# Patient Record
Sex: Female | Born: 1965 | Race: Asian
Health system: Southern US, Academic
[De-identification: ages and names within clinical notes are randomized; demographics above are authoritative.]

## PROBLEM LIST (undated history)

## (undated) ENCOUNTER — Encounter

## (undated) ENCOUNTER — Telehealth

## (undated) ENCOUNTER — Other Ambulatory Visit

## (undated) ENCOUNTER — Encounter: Attending: Obstetrics & Gynecology

## (undated) ENCOUNTER — Encounter: Attending: Dermatology

## (undated) ENCOUNTER — Encounter: Attending: Women's Health

## (undated) ENCOUNTER — Encounter: Attending: Family

## (undated) ENCOUNTER — Inpatient Hospital Stay

## (undated) ENCOUNTER — Telehealth: Attending: Dermatology

## (undated) ENCOUNTER — Telehealth: Attending: Family

## (undated) DIAGNOSIS — E119 Type 2 diabetes mellitus without complications: Secondary | ICD-10-CM

## (undated) DIAGNOSIS — T8859XA Other complications of anesthesia, initial encounter: Secondary | ICD-10-CM

## (undated) DIAGNOSIS — F419 Anxiety disorder, unspecified: Secondary | ICD-10-CM

## (undated) HISTORY — PX: BREAST SURGERY: SHX581

## (undated) HISTORY — PX: TONSILLECTOMY: SUR1361

## (undated) HISTORY — PX: ABDOMINAL HYSTERECTOMY: SHX81

---

## 2015-08-23 ENCOUNTER — Encounter: Attending: Family Medicine

## 2015-08-27 ENCOUNTER — Encounter: Attending: Family Medicine

## 2015-09-03 ENCOUNTER — Inpatient Hospital Stay: Admit: 2015-09-03 | Discharge: 2015-09-04

## 2015-09-03 ENCOUNTER — Ambulatory Visit: Attending: Family Medicine

## 2015-09-03 DIAGNOSIS — D500 Iron deficiency anemia secondary to blood loss (chronic): Principal | ICD-10-CM

## 2015-09-03 DIAGNOSIS — I10 Essential (primary) hypertension: Principal | ICD-10-CM

## 2015-09-03 DIAGNOSIS — D501 Sideropenic dysphagia: Secondary | ICD-10-CM

## 2015-09-03 DIAGNOSIS — D649 Anemia, unspecified: Secondary | ICD-10-CM

## 2015-09-03 DIAGNOSIS — H0016 Chalazion left eye, unspecified eyelid: Secondary | ICD-10-CM

## 2015-09-03 MED ORDER — FERROUS SULFATE 325 (65 FE) MG PO TABS-TBEC JX
325 mg | Freq: Three times a day (TID) | ORAL | 3 refills | Status: CP
Start: 2015-09-03 — End: 2015-09-03

## 2015-09-03 MED ORDER — DOCUSATE SODIUM 100 MG PO CAPS
100 mg | Freq: Two times a day (BID) | ORAL | 6 refills | Status: CP | PRN
Start: 2015-09-03 — End: ?

## 2015-09-03 MED ORDER — ERYTHROMYCIN 5 MG/GM OP OINT
1 | Freq: Three times a day (TID) | OPHTHALMIC | 0 refills | Status: CP
Start: 2015-09-03 — End: 2015-09-03

## 2015-09-03 MED ORDER — DOCUSATE SODIUM 100 MG PO CAPS
100 mg | Freq: Two times a day (BID) | ORAL | 6 refills | Status: CP | PRN
Start: 2015-09-03 — End: 2015-09-03

## 2015-09-03 MED ORDER — ERYTHROMYCIN 5 MG/GM OP OINT
1 | Freq: Three times a day (TID) | OPHTHALMIC | 0 refills | Status: CP
Start: 2015-09-03 — End: ?

## 2015-09-03 MED ORDER — FERROUS SULFATE 325 (65 FE) MG PO TABS-TBEC JX
325 mg | Freq: Three times a day (TID) | ORAL | 3 refills | Status: CP
Start: 2015-09-03 — End: 2017-10-04

## 2015-09-09 NOTE — Progress Notes
-  TE at 09/03/15 1502      Position Sitting    -TE at 09/03/15 1407 Sitting    -TE at 09/03/15 1502      Pulse 69    -TE at 09/03/15 1407       Resp 16    -TE at 09/03/15 1407       Temp 36.9 ?C (98.5 ?F)    -TE at 09/03/15 1407       Temperature Source Oral    -TE at 09/03/15 1407       Pain Score Zero    -TE at 09/03/15 1407       Last Menstrual Period 08/27/15    -TE at 09/03/15 1407       Education/Communication Barriers?    Learning/Communication Barriers? No    -TE at 09/03/15 1407       Fall Risk Assessment    Had recent fall / Last 6 months? No recent fall    -TE at 09/03/15 1407       Does patient have a fear of falling? No    -TE at 09/03/15 1407         User Key  (r) = Recorded By, (t) = Taken By, (c) = Cosigned By    Initials Name Effective Dates    Ella Jubilee, [place] 05/14/15 -         Physical Exam   Constitutional: She is oriented to person, place, and time.   HENT:   Head: Normocephalic and atraumatic.   Mouth/Throat: Oropharynx is clear and moist.   Eyes: Conjunctivae and EOM are normal. Pupils are equal, round, and reactive to light. No scleral icterus.   At corner of  Left  Eye with   Bump  Papular, With edema, white discharge,  Non tender  Mild  Erythema    Cardiovascular: Normal rate, regular rhythm and normal heart sounds.    Pulmonary/Chest: Effort normal and breath sounds normal. No respiratory distress. She has no wheezes. She has no rales.   Abdominal: Soft. Bowel sounds are normal.   Neurological: She is alert and oriented to person, place, and time.   Psychiatric: She has a normal mood and affect. Her behavior is normal.   Nursing note and vitals reviewed.      Assessment:       ICD-10-CM ICD-9-CM    1. Iron deficiency anemia due to chronic blood loss D50.0 280.0 CBC and Differential      Iron Tibc %Sat   2. Chalazion, left H00.16 373.2 erythromycin (ROMYCIN) 0.5 % Ointment      DISCONTINUED: erythromycin (ROMYCIN) 0.5 % Ointment

## 2015-09-09 NOTE — Progress Notes
Sig: Take 1 capsule by mouth 2 times daily as needed for constipation.     Dispense:  60 capsule     Refill:  6     Orders Placed This Encounter   Procedures   ? Iron Tibc %Sat       Health Maintenance was reviewed. The patient's HM Topic list was:                                            Health Maintenance   Topic Date Due   ? Preventive Wellness Visit  08/09/2014   ? DTaP,Tdap,and Td Vaccines (1 - Tdap) 04/01/2016 (Originally 01/02/1986)   ? Influenza Vaccine (1) 10/11/2015   ? Lipid Profile  04/16/2016   ? Basic Metabolic Panel  [id number]   ? Mammogram Discussion  04/16/2016   ? Pap Smear  08/08/2016   ? USPSTF HIV Risk Assessment  Completed       Return in about 4 weeks (around 10/01/2015) for htn, anemia.

## 2015-09-09 NOTE — Progress Notes
?   bisoprolol-hydroCHLOROthiazide (ZIAC) 5-6.25 MG Tablet TAKE 1 TABLET BY MOUTH DAILY   ? meloxicam (MOBIC) 15 MG Tablet TAKE 1 TABLET BY MOUTH DAILY AS NEEDED   ? PROAIR HFA 108 (90 BASE) MCG/ACT Aerosol Solution Inhale 1 puff every 6 hours as needed for wheezing.   ? tiZANidine (ZANAFLEX) 4 MG Tablet Take 1 tablet by mouth every 8 hours as needed (muscle cramp).   ? VENTOLIN HFA 108 (90 BASE) MCG/ACT Aerosol Solution Inhale 2 puffs every 4 to 6 hours as needed. INHAE 1-2 PUFFS BY MOUTH EVERY 4-6 HOURS AS NEEDED     No current facility-administered medications on file prior to visit.      Allergies   Allergen Reactions   ? Lisinopril Other (See Comments)     Cough   ? No Known Drug Allergy      No Known Drug Allergy         Review of Systems  Review of Systems   Constitutional: Positive for fatigue. Negative for activity change, appetite change, chills and unexpected weight change.   Eyes: Positive for discharge and itching. Negative for photophobia, pain, redness and visual disturbance.   Respiratory: Negative for cough, shortness of breath and wheezing.    Cardiovascular: Negative for chest pain and leg swelling.   Gastrointestinal: Negative for blood in stool, constipation, diarrhea and nausea.   Endocrine: Positive for cold intolerance.   Genitourinary: Negative for hematuria, urgency and vaginal pain.   Neurological: Negative for dizziness, seizures, light-headedness and numbness.         Objective:        VITAL SIGNS (all recorded)      Clinic Vitals       09/03/15 1406 09/03/15 1501          Amb Encounter Vitals    Weight 104.8 kg (231 lb)    -TE at 09/03/15 1407       Height 1.651 m (5\' 5" )    -TE at 09/03/15 1407       BMI (Calculated) 38.52    -TE at 09/03/15 1407       BSA (Calculated - sq m) 2.19    -TE at 09/03/15 1407       BP (!)  149/100    -TE at 09/03/15 1407 (!)  145/97   Repeat BP     -TE at 09/03/15 1502      BP Location Left upper arm    -TE at 09/03/15 1407 Left lower arm

## 2015-09-09 NOTE — Progress Notes
Subjective:   Debbie Spencer is a 50 y.o. female being seen today for Eye Problem (outer corner of left eye (skin tag))       HPI    Left eye : worse , With mole right at  Lateral  Corner  For  X  1  Month with  Swelling , irritation ,  Constant , mild improvement since started , Over top mole ,  Looked online  Applied    Warm  Compress ,and  Slowly improve will enlarged swollen  Swollen  no pain  No drainage  ,  No  Change  In eye site     Anemia :  Stable, Has irregular   Periods  Can go 2-3 months no period then  Has  Heavy period with  Small clots  Lmp  7/ 18/2017  Was  Heavy ,  Anemia chronic   Lat h/h  Decreased  Admit   Not taking  Iron  Daily  Will  Has been a while  Since  Taking iron       Htn : worse,  Took this  Am ,  Admits  No compliant with  Medication    Past Medical History:   Diagnosis Date   ? Anemia    ? Hypertension      Past Surgical History:   Procedure Laterality Date   ? CESAREAN SECTION      Surgery Description: Cesarean Section;  Problem Comments: - 1985 (Created by Conversion)     Family History   Problem Relation Age of Onset   ? Breast Cancer Neg Hx    ? Ovarian Cancer Neg Hx    ? Colon Cancer Neg Hx    ? Endometrial Cancer Neg Hx    ? Cancer Neg Hx    ? Li-Fraumeni Syndrome Neg Hx    ? Cowden Syndrome Neg Hx    ? DES Usage Neg Hx    ? BRCA 1/2 Neg Hx      Social History     Social History   ? Marital status: Divorced     Spouse name: N/A   ? Number of children: N/A   ? Years of education: N/A     Occupational History   ? Not on file.     Social History Main Topics   ? Smoking status: Never Smoker   ? Smokeless tobacco: Never Used   ? Alcohol use Not on file   ? Drug use: Not on file   ? Sexual activity: Not on file     Other Topics Concern   ? Not on file     Social History Narrative     Current Outpatient Prescriptions on File Prior to Visit   Medication Sig   ? amLODIPine (NORVASC) 5 MG Tablet Take 1 Tablet by mouth daily. - for blood pressure.

## 2015-09-09 NOTE — Progress Notes
Reason:  Physician ordered labs  Amount:  2 Tubes  Type:  Vaccutainer  Site:  Vein  left arm  Reaction:  None    Draw performed by:  [id number],  09/03/2015

## 2015-09-09 NOTE — Progress Notes
3. 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 Anemia :  Iron deficiency ,  Likely due to heavy periods, will restart iron , take with OJ  And will restart stool softener, will repeat labs , importance of compliance= stressed     2. Eye ::  At corner of  Eye not in eye will give topical ointment to  Help, apply warm compress, rtc sooner if worse or no improvement     3. Htn : not  At goal , non compliant with  Meds, Importance of medication compliance stressed and counseled patient on risks to health of uncontrolled high blood pressure including but not limited to stroke, kidney failure, blindness, heart attack, heart failure, and death. Patient verbalized understanding    Orders Placed This Encounter   Medications   ? DISCONTD: erythromycin (ROMYCIN) 0.5 % Ointment     Sig: Place 1 application into the left eye 3 times daily for 7 days.     Dispense:  1 Tube     Refill:  0   ? DISCONTD: ferrous sulfate 325 (65 FE) MG tablet     Sig: Take 1 tablet by mouth 3 times daily (with meals). Take 2 hours prior to, or 4 hours after antacids., take with orange juice     Dispense:  90 tablet     Refill:  3   ? DISCONTD: docusate sodium (COLACE) 100 MG Capsule     Sig: Take 1 capsule by mouth 2 times daily as needed for constipation.     Dispense:  60 capsule     Refill:  6   ? ferrous sulfate 325 (65 FE) MG tablet     Sig: Take 1 tablet by mouth 3 times daily (with meals). Take 2 hours prior to, or 4 hours after antacids., take with orange juice     Dispense:  90 tablet     Refill:  3   ? erythromycin (ROMYCIN) 0.5 % Ointment     Sig: Place 1 application into the left eye 3 times daily for 7 days.     Dispense:  1 Tube     Refill:  0   ? docusate sodium (COLACE) 100 MG Capsule

## 2015-10-10 DIAGNOSIS — I10 Essential (primary) hypertension: Principal | ICD-10-CM

## 2015-10-10 DIAGNOSIS — J45909 Unspecified asthma, uncomplicated: Secondary | ICD-10-CM

## 2015-10-11 MED ORDER — BISOPROLOL-HYDROCHLOROTHIAZIDE 5-6.25 MG PO TABS
ORAL_TABLET | 1 refills | Status: CP
Start: 2015-10-11 — End: 2016-06-30

## 2015-10-11 MED ORDER — AMLODIPINE BESYLATE 5 MG PO TABS
5 mg | Freq: Every day | ORAL | 1 refills | Status: CP
Start: 2015-10-11 — End: 2016-06-30

## 2015-10-11 MED ORDER — PROAIR HFA 108 (90 BASE) MCG/ACT IN AERS
1 | Freq: Four times a day (QID) | RESPIRATORY_TRACT | 1 refills | Status: CP | PRN
Start: 2015-10-11 — End: 2016-06-30

## 2016-05-29 ENCOUNTER — Encounter: Attending: Family Medicine

## 2016-06-02 ENCOUNTER — Encounter: Attending: Family Medicine

## 2016-06-29 ENCOUNTER — Encounter

## 2016-06-29 ENCOUNTER — Ambulatory Visit: Attending: Family Medicine

## 2016-06-29 DIAGNOSIS — I10 Essential (primary) hypertension: Principal | ICD-10-CM

## 2016-06-29 DIAGNOSIS — D649 Anemia, unspecified: Secondary | ICD-10-CM

## 2016-06-29 DIAGNOSIS — D500 Iron deficiency anemia secondary to blood loss (chronic): Secondary | ICD-10-CM

## 2016-06-29 DIAGNOSIS — Z1231 Encounter for screening mammogram for malignant neoplasm of breast: Secondary | ICD-10-CM

## 2016-06-29 DIAGNOSIS — J4520 Mild intermittent asthma, uncomplicated: Secondary | ICD-10-CM

## 2016-06-29 DIAGNOSIS — M1711 Unilateral primary osteoarthritis, right knee: Secondary | ICD-10-CM

## 2016-06-29 DIAGNOSIS — Z136 Encounter for screening for cardiovascular disorders: Secondary | ICD-10-CM

## 2016-06-29 DIAGNOSIS — Z13220 Encounter for screening for lipoid disorders: Secondary | ICD-10-CM

## 2016-06-29 MED ORDER — PROAIR HFA 108 (90 BASE) MCG/ACT IN AERS
1 | Freq: Four times a day (QID) | RESPIRATORY_TRACT | 1 refills | Status: CP | PRN
Start: 2016-06-29 — End: 2016-06-30

## 2016-06-29 MED ORDER — TIZANIDINE HCL 4 MG PO TABS
4 mg | Freq: Three times a day (TID) | ORAL | 0 refills | Status: CP | PRN
Start: 2016-06-29 — End: 2017-10-04

## 2016-06-29 MED ORDER — MELOXICAM 15 MG PO TABS
1 refills | Status: CP
Start: 2016-06-29 — End: 2016-06-30

## 2016-06-29 MED ORDER — MELOXICAM 15 MG PO TABS
1 refills | Status: CP
Start: 2016-06-29 — End: 2017-10-04

## 2016-06-29 MED ORDER — TIZANIDINE HCL 4 MG PO TABS
4 mg | Freq: Three times a day (TID) | ORAL | 0 refills | Status: CP | PRN
Start: 2016-06-29 — End: 2016-06-30

## 2016-06-29 MED ORDER — PROAIR HFA 108 (90 BASE) MCG/ACT IN AERS
1 | Freq: Four times a day (QID) | RESPIRATORY_TRACT | 1 refills | Status: CP | PRN
Start: 2016-06-29 — End: 2016-12-18

## 2016-06-29 MED ORDER — BISOPROLOL-HYDROCHLOROTHIAZIDE 5-6.25 MG PO TABS
ORAL_TABLET | 6 refills | Status: CP
Start: 2016-06-29 — End: 2016-10-20

## 2016-06-29 MED ORDER — AMLODIPINE BESYLATE 10 MG PO TABS
10 mg | Freq: Every day | ORAL | 7 refills | Status: CP
Start: 2016-06-29 — End: 2017-09-23

## 2016-06-30 DIAGNOSIS — D500 Iron deficiency anemia secondary to blood loss (chronic): Principal | ICD-10-CM

## 2016-07-01 DIAGNOSIS — I10 Essential (primary) hypertension: Principal | ICD-10-CM

## 2016-07-01 DIAGNOSIS — D649 Anemia, unspecified: Secondary | ICD-10-CM

## 2016-07-01 NOTE — Progress Notes
Neurological: She is alert and oriented to person, place, and time.   Psychiatric: She has a normal mood and affect.   Nursing note and vitals reviewed.       Assessment:       ICD-10-CM ICD-9-CM    1. Essential hypertension with goal blood pressure less than 140/90 I10 401.9 amLODIPine (NORVASC) 10 MG PO Tablet      bisoprolol-hydroCHLOROthiazide (ZIAC) 5-6.25 MG PO Tablet      Comprehensive Metabolic Panel      Comprehensive Metabolic Panel   2. Iron deficiency anemia due to chronic blood loss D50.0 280.0 LIPID PANEL      CBC and Differential      Iron Tibc %Sat      LIPID PANEL      CBC and Differential      Iron Tibc %Sat      CBC with Differential panel result      MANUAL DIFF JAX      MANUAL DIFF JAX      MORPHOLOGY JAX      MORPHOLOGY JAX   3. Encounter for lipid screening for cardiovascular disease Z13.220 V77.91     Z13.6 V81.2    4. Encounter for screening mammogram for breast cancer Z12.31 V76.12 MAM Screening   5. Mild intermittent asthma in adult without complication K27.06 237.62 PROAIR HFA 108 (90 Base) MCG/ACT IN Aerosol Solution      DISCONTINUED: PROAIR HFA 108 (90 Base) MCG/ACT IN Aerosol Solution   6. Primary osteoarthritis of right knee M17.11 715.16 meloxicam (MOBIC) 15 MG PO Tablet      tiZANidine (ZANAFLEX) 4 MG PO Tablet      DISCONTINUED: meloxicam (MOBIC) 15 MG PO Tablet      DISCONTINUED: tiZANidine (ZANAFLEX) 4 MG PO Tablet          Plan:   Htn  Not  Controlled  Will increase  Amlodipine dose , Importance of medication compliance stressed and counseled patient on risks to health of uncontrolled high blood pressure including but not limited to stroke, kidney failure, blindness, heart attack, heart failure, and death. Patient verbalized understanding    Anemia :  Repeat  Iron testing  Consider referral to hematology if no improvement     Djd : stable  Refill  Nsaid's and muscle relaxer to take prn   Orders Placed This Encounter   Medications   ? amLODIPine (NORVASC) 10 MG PO Tablet

## 2016-07-01 NOTE — Progress Notes
Reason:  Physician ordered labs  Amount:  3 Tubes  Type:  Vaccutainer  Site:  Vein  left arm  Reaction:  None    Draw performed by:  [id number],  06/29/2016

## 2016-07-01 NOTE — Progress Notes
Sig: Take 1 tablet by mouth daily. - for blood pressure.     Dispense:  30 tablet     Refill:  7     Dose change stop amlodipine 5 mg   ? bisoprolol-hydroCHLOROthiazide (ZIAC) 5-6.25 MG PO Tablet     Sig: TAKE 1 TABLET BY MOUTH DAILY     Dispense:  30 tablet     Refill:  6   ? DISCONTD: PROAIR HFA 108 (90 Base) MCG/ACT IN Aerosol Solution     Sig: Inhale 1 puff every 6 hours as needed for wheezing.     Dispense:  8.5 g     Refill:  1   ? DISCONTD: meloxicam (MOBIC) 15 MG PO Tablet     Sig: TAKE 1 TABLET BY MOUTH DAILY AS NEEDED     Dispense:  30 tablet     Refill:  1   ? DISCONTD: tiZANidine (ZANAFLEX) 4 MG PO Tablet     Sig: Take 1 tablet by mouth every 8 hours as needed (muscle cramp).     Dispense:  15 tablet     Refill:  0   ? meloxicam (MOBIC) 15 MG PO Tablet     Sig: TAKE 1 TABLET BY MOUTH DAILY AS NEEDED     Dispense:  30 tablet     Refill:  1   ? tiZANidine (ZANAFLEX) 4 MG PO Tablet     Sig: Take 1 tablet by mouth every 8 hours as needed (muscle cramp).     Dispense:  15 tablet     Refill:  0   ? PROAIR HFA 108 (90 Base) MCG/ACT IN Aerosol Solution     Sig: Inhale 1 puff every 6 hours as needed for wheezing.     Dispense:  8.5 g     Refill:  1     Orders Placed This Encounter   Procedures   ? MAM Screening   ? LIPID PANEL   ? Iron Tibc %Sat   ? Comprehensive Metabolic Panel   ? CBC with Differential panel result   ? MANUAL DIFF JAX   ? [place] Maintenance was reviewed. The patient's HM Topic list was:                                            Health Maintenance   Topic Date Due   ? DTaP,Tdap,and Td Vaccines (1 - Tdap) 01/02/1986   ? Preventive Wellness Visit  08/09/2014   ? Breast Cancer Screening Discussion  04/16/2016   ? Pap Smear  08/08/2016   ? Influenza Vaccine (Season Ended) 10/10/2016   ? Lipid Profile  06/29/2017   ? Basic Metabolic Panel  04/54/0981   ? USPSTF HIV Risk Assessment  Completed       Return in about 4 weeks (around 07/27/2016) for htn  anemia.

## 2016-07-01 NOTE — Progress Notes
Subjective:   Debbie Spencer is a 51 y.o. female being seen today for Medications Refill       HPI   htn :  Stable not controlled  Reports  Taking medication      Anemia:  Stable, on iron  Has iron def ,   Still with heavy  Periods , reports not sexually  Active, and reports increased family stress lmp 4/8 /2018     Djd: stable  uses  Nsaid  And  Muscle relaxer prn   Past Medical History:   Diagnosis Date   ? Anemia    ? Hypertension      Past Surgical History:   Procedure Laterality Date   ? CESAREAN SECTION      Surgery Description: Cesarean Section;  Problem Comments: - 1985 (Created by Conversion)     Family History   Problem Relation Age of Onset   ? Breast Cancer Neg Hx    ? Ovarian Cancer Neg Hx    ? Colon Cancer Neg Hx    ? Endometrial Cancer Neg Hx    ? Cancer Neg Hx    ? Li-Fraumeni Syndrome Neg Hx    ? Cowden Syndrome Neg Hx    ? DES Usage Neg Hx    ? BRCA 1/2 Neg Hx      Social History     Social History   ? Marital status: Divorced     Spouse name: N/A   ? Number of children: N/A   ? Years of education: N/A     Occupational History   ? Not on file.     Social History Main Topics   ? Smoking status: Never Smoker   ? Smokeless tobacco: Never Used   ? Alcohol use Not on file   ? Drug use: Unknown   ? Sexual activity: Not on file     Other Topics Concern   ? Not on file     Social History Narrative   ? No narrative on file     Current Outpatient Prescriptions on File Prior to Visit   Medication Sig   ? ferrous sulfate 325 (65 FE) MG tablet Take 1 tablet by mouth 3 times daily (with meals). Take 2 hours prior to, or 4 hours after antacids., take with orange juice   ? docusate sodium (COLACE) 100 MG Capsule Take 1 capsule by mouth 2 times daily as needed for constipation.     No current facility-administered medications on file prior to visit.      Allergies   Allergen Reactions   ? Lisinopril Other (See Comments)     Cough   ? No Known Drug Allergy      No Known Drug Allergy         Review of Systems

## 2016-07-01 NOTE — Progress Notes
Review of Systems   Constitutional: Negative for chills and fever.   Respiratory: Negative for shortness of breath.    Cardiovascular: Negative for chest pain and palpitations.   Neurological: Negative for dizziness.           Objective:        VITAL SIGNS (all recorded)      [place] Name 06/29/16 1559                Amb Encounter Vitals    Weight 105.2 kg (232 lb)    -TE at 06/29/16 1600       Height 1.651 m (5\' 5" )    -TE at 06/29/16 1600       BMI (Calculated) 38.69    -TE at 06/29/16 1600       BSA (Calculated - sq m) 2.2    -TE at 06/29/16 1600       BP 150/79    -TE at 06/29/16 1600       BP Location Left upper arm    -TE at 06/29/16 1600       Position Sitting    -TE at 06/29/16 1600       Pulse 84    -TE at 06/29/16 1600       Resp 20    -TE at 06/29/16 1600       Temp 37.3 ?C (99.1 ?F)    -TE at 06/29/16 1600       Temperature Source Oral    -TE at 06/29/16 1600       Pain Score Zero    -TE at 06/29/16 1600       Last Menstrual Period 05/17/16    -TE at 06/29/16 1600          Education/Communication Barriers?    Learning/Communication Barriers? No    -TE at 06/29/16 1600          Fall Risk Assessment    Had recent fall / Last 6 months? No recent fall    -TE at 06/29/16 1600       Does patient have a fear of falling? No    -TE at 06/29/16 1600         User Key  (r) = Recorded By, (t) = Taken By, (c) = Cosigned By    Initials Name Effective Dates    Ella Jubilee, [place] 05/14/15 -         Physical Exam   Constitutional: She is oriented to person, place, and time. She appears well-nourished. No distress.   HENT:   Mouth/Throat: Oropharynx is clear and moist.   Cardiovascular: Normal rate, regular rhythm and normal heart sounds.  Exam reveals no friction rub.    No murmur heard.  Pulmonary/Chest: Effort normal and breath sounds normal. No respiratory distress. She has no wheezes.   Musculoskeletal: She exhibits no edema.   Lymphadenopathy:     She has no cervical adenopathy.

## 2016-07-03 ENCOUNTER — Inpatient Hospital Stay: Admit: 2016-07-03 | Discharge: 2016-07-04

## 2016-07-03 DIAGNOSIS — I10 Essential (primary) hypertension: Principal | ICD-10-CM

## 2016-07-03 DIAGNOSIS — Z1231 Encounter for screening mammogram for malignant neoplasm of breast: Principal | ICD-10-CM

## 2016-07-03 DIAGNOSIS — D649 Anemia, unspecified: Secondary | ICD-10-CM

## 2016-07-22 ENCOUNTER — Encounter: Attending: Internal Medicine

## 2016-07-27 ENCOUNTER — Ambulatory Visit: Admit: 2016-07-27 | Discharge: 2016-07-27

## 2016-07-27 DIAGNOSIS — D500 Iron deficiency anemia secondary to blood loss (chronic): Secondary | ICD-10-CM

## 2016-07-27 DIAGNOSIS — Z7951 Long term (current) use of inhaled steroids: Secondary | ICD-10-CM

## 2016-07-27 DIAGNOSIS — D509 Iron deficiency anemia, unspecified: Principal | ICD-10-CM

## 2016-07-27 DIAGNOSIS — N920 Excessive and frequent menstruation with regular cycle: Secondary | ICD-10-CM

## 2016-07-27 DIAGNOSIS — I10 Essential (primary) hypertension: Principal | ICD-10-CM

## 2016-07-27 DIAGNOSIS — Z79899 Other long term (current) drug therapy: Secondary | ICD-10-CM

## 2016-07-27 DIAGNOSIS — E669 Obesity, unspecified: Secondary | ICD-10-CM

## 2016-07-27 DIAGNOSIS — M5030 Other cervical disc degeneration, unspecified cervical region: Secondary | ICD-10-CM

## 2016-07-27 DIAGNOSIS — Z6838 Body mass index (BMI) 38.0-38.9, adult: Secondary | ICD-10-CM

## 2016-07-27 DIAGNOSIS — D649 Anemia, unspecified: Secondary | ICD-10-CM

## 2016-07-27 DIAGNOSIS — M1711 Unilateral primary osteoarthritis, right knee: Secondary | ICD-10-CM

## 2016-07-27 NOTE — Progress Notes
CO2 21 06/29/2016    CO2 22 07/02/2014    BUN 14 06/29/2016    CREATININE 1.10 (H) 06/29/2016    CREATININE 1.02 (H) 07/02/2014    GLU 84 06/29/2016    GLU 85 07/02/2014    CALCIUM 9.0 06/29/2016    CALCIUM 8.9 07/02/2014    TPROT 7.8 06/29/2016    TPROT 7.7 04/10/2013    ALB 3.7 (L) 06/29/2016    ALB 3.6 (L) 04/10/2013    AST 16 06/29/2016    AST 19 04/10/2013    ALT 10 06/29/2016    ALT 12 04/10/2013    EGFR >59 06/29/2016    TBILI 0.2 06/29/2016    TBILI 0.3 04/10/2013    ALKPHOS 47 06/29/2016    ALKPHOS 51 04/10/2013         ASSESSMENT:  1. Moderate microcytic anemia most likely iron deficiency   2. Occasional irregualar heavy menstrual bleeding  3. HTN  4. ECOG 0    PLAN:  - discussed plan with the patient  - encouraged her to take iron TID  - cont taking b12 supplement  - recheck her CBC,retic,ferritin,b12,folate and hemolysis work up, also check SPEP  - agree with GI follow up   - she is planning to have colonoscopy in 12/2016 agree with that  - if not able to tolerate the oral iron or no response to it than will consider IV iron    RTC in 8 weeks    Seen with Dr Breck Coons  [id number]  Medical oncology fellow  Pager- [phone]

## 2016-07-27 NOTE — Progress Notes
6 hours as needed for wheezing.   ? tiZANidine (ZANAFLEX) 4 MG PO Tablet Take 1 tablet by mouth every 8 hours as needed (muscle cramp).       ALLERGIES:  Allergies   Allergen Reactions   ? Lisinopril Other (See Comments)     Cough   ? No Known Drug Allergy      No Known Drug Allergy     PAST MEDICAL HISTORY:  Past Medical History:   Diagnosis Date   ? Anemia    ? Hypertension      PAST SURGICAL HISTORY:  Past Surgical History:   Procedure Laterality Date   ? CESAREAN SECTION      Surgery Description: Cesarean Section;  Problem Comments: - 1985 (Created by Conversion)       FAMILY HISTORY:  Family History   Problem Relation Age of Onset   ? Breast Cancer Neg Hx    ? Ovarian Cancer Neg Hx    ? Colon Cancer Neg Hx    ? Endometrial Cancer Neg Hx    ? Cancer Neg Hx    ? Li-Fraumeni Syndrome Neg Hx    ? Cowden Syndrome Neg Hx    ? DES Usage Neg Hx    ? BRCA 1/2 Neg Hx        SOCIAL HISTORY:  Social History     Social History   ? Marital status: Divorced     Spouse name: N/A   ? Number of children: N/A   ? Years of education: N/A     Social History Main Topics   ? Smoking status: Never Smoker   ? Smokeless tobacco: Never Used   ? Alcohol use None   ? Drug use: Unknown   ? Sexual activity: Not Asked     Other Topics Concern   ? None     Social History Narrative   ? None     ROS:  General: Denies change in weight ,appetite  Respiratory: Denies shortness of breath,cough,hemoptysis  CVS: Denies chest pain, palpitations  GI: Denies abdominal pain, diarrhea,constipation,hematochezia,melena  CNS: Denies headache, visual disturbances, focal weakness  Skin: Denies rash  Musculoskeletal: Denies joint pain, difficulty walking  Psyche: denies changes in mood  All other systems otherwise unremarkable  ECOG performance status:0      VITAL SIGNS:  BP 122/86  - Pulse 74  - Temp 37.1 ?C (98.8 ?F)  - Resp 18  - Ht 1.651 m (5' 5")  - Wt 104.3 kg (230 lb)  - LMP 07/02/2016  - BMI 38.27 kg/m?       PHYSICAL EXAM:  Vitals as Stated

## 2016-07-27 NOTE — Progress Notes
REFERRING PHYSICIAN  [place]bie [place] [address]    CONSULT REASON:    Anemia    ACTIVE PROBLEMS:  Patient Active Problem List   Diagnosis   ? Dysthymic disorder   ? Unspecified dental caries   ? Allergic rhinitis, cause unspecified   ? Obesity, unspecified   ? Unspecified asthma(493.90)   ? Viral warts, unspecified   ? Anemia   ? Carpal tunnel syndrome   ? Degenerative disc disease, cervical   ? Primary osteoarthritis of right knee   ? Osteoarthritis of acromioclavicular joint   ? Idiopathic hypertension       CHIEF COMPLAINT:  New Patient (Iron deficiency anemia )    HPI:   Debbie Spencer is a 51 y.o. pleasant AAF being seen for anemia.  She has PMH of HTN and allergies. She has 7 children. She was noted to be anemia atleast since 2012. Anemia is  Microcytic MCV ~70  Moderate ~8  Associated occasional heavy mentstrual bleeding  No ice craving/cp/sob/palpitations/any GI bleed  She started taking iron supplement BID 10months ago (04/2016)  She started taking b12 in 07/2016.  She does have fatigue.  Ferritin was low in 2015 but than was in normal range.  She is tolerating iron well but does not take it very regularly.  No recent surgery    No personal or family history  Of cancer or hematological disorder that she knows.    CURRENT MEDS:  Current Med List   Medication Sig   ? amLODIPine (NORVASC) 10 MG PO Tablet Take 1 tablet by mouth daily. - for blood pressure.   ? bisoprolol-hydroCHLOROthiazide (ZIAC) 5-6.25 MG PO Tablet TAKE 1 TABLET BY MOUTH DAILY   ? docusate sodium (COLACE) 100 MG Capsule Take 1 capsule by mouth 2 times daily as needed for constipation.   ? ferrous sulfate 325 (65 FE) MG tablet Take 1 tablet by mouth 3 times daily (with meals). Take 2 hours prior to, or 4 hours after antacids., take with orange juice   ? meloxicam (MOBIC) 15 MG PO Tablet TAKE 1 TABLET BY MOUTH DAILY AS NEEDED   ? PROAIR HFA 108 (90 Base) MCG/ACT IN Aerosol Solution Inhale 1 puff every

## 2016-07-27 NOTE — Progress Notes
KVQ:QVZD developed, well nourished  HEENT:Normal conjunctivae and eyelids Hearing is intact.Normal dentition. No gum bleeding.  Oropharynx shows no lesions or thrush.  NECK: Supple without masses. No thyromegaly.  CARDIOVASCULAR: Regular rate and rhythm. No m/r/g.  No pedal edema.  LUNGS:Clear to auccultation bilaterally.  No respiratory distress.   BREAST EXAM:   ABDOMEN: Soft, non-tender, non-distended, no hepatosplenomegaly. No guarding or rebound. No masses. No ascites  LYMPHATICS: No lymphadenopathy in the neck, axilla or supraclavicular area. no inguinal lymphadenopathy.  EXTREMITIES: Normal digits and nails.  SKIN: No bruising. No rashes. No ecchymosis or purpura.  NEURO: no focal deficits. Alert and oriented times 3, no acute distress.  Normal mood and affect.  ECOG = 0\      RESULTS:    CBC (with or without Differential):   Lab Results   Component Value Date    WBC 7.94 06/29/2016    WBC 7.7 11/09/2014    HGB 8.2 (L) 06/29/2016    HGB 10.2 (L) 11/09/2014    HCT 31.0 (L) 06/29/2016    HCT 34.6 (L) 11/09/2014    MCV 71.8 (L) 06/29/2016    MCV 86.9 11/09/2014    MCH 19.0 (L) 06/29/2016    MCH 25.6 (L) 11/09/2014    MCHC 26.5 (L) 06/29/2016    MCHC 29.5 (L) 11/09/2014    RDW 25.9 (H) 06/29/2016    RDW 17.7 (H) 11/09/2014    PLATCOUNT 297 06/29/2016    MPV 9.0 (L) 06/29/2016    MPV 10.0 11/09/2014    NEUTROPCT 70 06/29/2016    NEUTROPCT 58.0 08/03/2012    LYMPHPCT 18 06/29/2016    LYMPHPCT 30.0 08/03/2012    MONOPCT 6 06/29/2016    MONOPCT 7.0 (H) 08/03/2012    EOSPCT 4 06/29/2016    EOSPCT 5.0 (H) 08/03/2012    BASOPCT 0 06/29/2016    BASOPCT 0.3 07/17/2011    BASOPCT  07/17/2011     Digital Differential not available. Refer to Automated or Manual Differential results below.    DIFFTYPE Manual Differential 08/03/2012    and BMP/CMP:   Lab Results   Component Value Date    NA 140 06/29/2016    NA 134 (L) 07/02/2014    K 4.1 06/29/2016    K 4.2 07/02/2014    CL 104 06/29/2016    CL 100 (L) 07/02/2014

## 2016-07-28 ENCOUNTER — Inpatient Hospital Stay: Admit: 2016-07-28 | Discharge: 2016-07-29

## 2016-07-28 DIAGNOSIS — D500 Iron deficiency anemia secondary to blood loss (chronic): Principal | ICD-10-CM

## 2016-07-28 NOTE — Progress Notes
Teaching/Attestation Statement  I saw and evaluated the patient.  I reviewed the Fellow note and agree with the assessment and plan. See Fellow's note for details.

## 2016-09-21 ENCOUNTER — Encounter

## 2016-10-20 DIAGNOSIS — I10 Essential (primary) hypertension: Principal | ICD-10-CM

## 2016-10-20 MED ORDER — BISOPROLOL-HYDROCHLOROTHIAZIDE 5-6.25 MG PO TABS
ORAL_TABLET | 0 refills | Status: CP
Start: 2016-10-20 — End: 2017-01-19

## 2016-10-29 ENCOUNTER — Ambulatory Visit: Attending: Student in an Organized Health Care Education/Training Program

## 2016-10-29 DIAGNOSIS — K029 Dental caries, unspecified: Secondary | ICD-10-CM

## 2016-10-29 DIAGNOSIS — K0889 Other specified disorders of teeth and supporting structures: Principal | ICD-10-CM

## 2016-10-29 DIAGNOSIS — I10 Essential (primary) hypertension: Principal | ICD-10-CM

## 2016-10-29 DIAGNOSIS — D649 Anemia, unspecified: Secondary | ICD-10-CM

## 2016-10-29 NOTE — Progress Notes
Teaching/Attestation Statement  I discussed this patient with the Resident and agree with the findings and plan as documented in the note. I was on the premises and available. I agree with the medical decision making as documented with the following addidtions/exception: -

## 2016-10-29 NOTE — Progress Notes
2. Dental caries K02.9 521.00     51 y.o. female presents to [facility] with dental pain and caries  for evaluation for extraction of teeth listed below. Due to the extend of the procedure, Local anesthesia is indicated.    Risk and benefits of procedures explain to patient including but not limited to lip/chin paresthesia, oroantral communication, risk of infection and damage to adjacent structures, and bleeding. Pt understood.    No orders of the following type(s) were placed in this encounter: Procedures    No orders of the following type(s) were placed in this encounter: Medications.       Plan:  - Extraction teeth # 11, 12 under Local anesthesia  - A1655 (Routine extraction) for #11, [place], DDS  10/29/2016 9:41 AM

## 2016-10-29 NOTE — Progress Notes
Department of Oral and Maxillofacial Surgery      Chief Complaint:    Chief Complaint   Patient presents with   ? Follow-up     NP EVAL/EXTR #12/21/20 BROKEN TEETH REFL BY [name] / [employer]        Referring Physician:  Maxwell Caul, DMD    History of Present Illness:  [name] is a very pleasant 51 y.o. female who presents to our clinic today for evaluation for extraction of teeth #11, 12. PMH significant for asthma, HTN. Pt complains of tenderness and sharp pain around teeth #11, 12 causing difficulty eating for the last several weeks. Pt denies SOB/f/n/v/c.    Allergies:  Allergies   Allergen Reactions   ? Ibuprofen Anaphylaxis   ? Lisinopril Other (See Comments)     Cough   ? No Known Drug Allergy      No Known Drug Allergy       Current Meds:  Current Outpatient Medications    Medication Sig Start Date End Date Taking? Authorizing Provider   amLODIPine (NORVASC) 10 MG PO Tablet Take 1 tablet by mouth daily. - for blood pressure. 06/29/16  Yes Mainor, Charna Busman, MD   bisoprolol-hydroCHLOROthiazide (ZIAC) 5-6.25 MG PO Tablet TAKE 1 TABLET BY MOUTH DAILY 10/20/16  Yes Mainor, Charna Busman, MD   docusate sodium (COLACE) 100 MG Capsule Take 1 capsule by mouth 2 times daily as needed for constipation. 09/03/15  Yes Mainor, Charna Busman, MD   ferrous sulfate 325 (65 FE) MG tablet Take 1 tablet by mouth 3 times daily (with meals). Take 2 hours prior to, or 4 hours after antacids., take with orange juice 09/03/15  Yes Mainor, Charna Busman, MD   meloxicam (MOBIC) 15 MG PO Tablet TAKE 1 TABLET BY MOUTH DAILY AS NEEDED 06/29/16  Yes Mainor, Charna Busman, MD   PROAIR HFA 108 (90 Base) MCG/ACT IN Aerosol Solution Inhale 1 puff every 6 hours as needed for wheezing. 06/29/16  Yes Mainor, Charna Busman, MD   tiZANidine (ZANAFLEX) 4 MG PO Tablet Take 1 tablet by mouth every 8 hours as needed (muscle cramp). 06/29/16  Yes Mainor, Charna Busman, MD        Active Problems:  Patient Active Problem List   Diagnosis

## 2016-10-29 NOTE — Progress Notes
?   Dysthymic disorder   ? Dental caries   ? Allergic rhinitis, cause unspecified   ? Obesity, unspecified   ? Unspecified asthma(493.90)   ? Viral warts, unspecified   ? Anemia   ? Carpal tunnel syndrome   ? Degenerative disc disease, cervical   ? Primary osteoarthritis of right knee   ? Osteoarthritis of acromioclavicular joint   ? Idiopathic hypertension   ? Pain, dental       PMH:  Past Medical History:   Diagnosis Date   ? Anemia    ? Hypertension        PSH:  Past Surgical History:   Procedure Laterality Date   ? CESAREAN SECTION      Surgery Description: Cesarean Section;  Problem Comments: - 1985 (Created by Conversion)       Social Hx:  Social History     Social History   ? Marital status: Divorced     Spouse name: N/A   ? Number of children: N/A   ? Years of education: N/A     Occupational History   ? Not on file.     Social History Main Topics   ? Smoking status: Never Smoker   ? Smokeless tobacco: Never Used   ? Alcohol use No   ? Drug use: No   ? Sexual activity: Yes     Partners: Male     Other Topics Concern   ? Not on file     Social History Narrative   ? No narrative on file       Family Hx:  Family History   Problem Relation Age of Onset   ? Breast Cancer Neg Hx    ? Ovarian Cancer Neg Hx    ? Colon Cancer Neg Hx    ? Endometrial Cancer Neg Hx    ? Cancer Neg Hx    ? Li-Fraumeni Syndrome Neg Hx    ? Cowden Syndrome Neg Hx    ? DES Usage Neg Hx    ? BRCA 1/2 Neg Hx        Review of Systems:  Constitutional: Negative.    HENT: Negative.    Eyes: Negative.    Respiratory: Negative.    Cardiovascular: Negative.    Gastrointestinal: Negative.    Genitourinary: Negative.    Musculoskeletal: Negative.    Skin: Negative.    Neurological: Negative.    Endo/Heme/Allergies: Negative.    Psychiatric/Behavioral: Negative.        Vitals:      VITAL SIGNS (all recorded)      [place] Name 10/29/16 0920 10/29/16 0922             Amb Encounter Vitals    Weight  ? 104.3 kg (230 lb)

## 2016-10-29 NOTE — Progress Notes
-  LM at 10/29/16 0924      Height  ? 1.651 m (5\' 5" )    -LM at 10/29/16 0924      BMI (Calculated)  ? 38.35    -LM at 10/29/16 0924      BSA (Calculated - sq m)  ? 2.19    -LM at 10/29/16 0924      BP  ? 134/82    -LM at 10/29/16 0924      Pulse  ? 66    -LM at 10/29/16 0924      Resp 20    -LM at 10/29/16 0920  ?      Temp 37.2 ?C (98.9 ?F)    -LM at 10/29/16 0920  ?         Education/Communication Barriers?    Learning/Communication Barriers? No    -LM at 10/29/16 0920  ?         Fall Risk Assessment    Had recent fall / Last 6 months? No recent fall    -LM at 10/29/16 0920  ?      Does patient have a fear of falling? No    -LM at 10/29/16 0920  ?        User Key  (r) = Recorded By, (t) = Taken By, (c) = Cosigned By    Initials Name Effective Dates    LM Milas Kocher -          Physical Exam:  Constitutional: well-developed, well-nourished, and in no distress.   HENT:   Head: Normocephalic and atraumatic.   Right Ear: External ear normal.   Left Ear: External ear normal.   Mouth/Throat: Oropharynx is clear and moist.   Mouth: MIO 26mm   no erythema, no drainage, tenderness to palpation   Small parulis above #12 is TTP  Dentition: grossly intact, partially edentulous    #11, 12 broken    #22 has incisal chip, extraction not indicated   Intraoral soft tissue: FOM soft, non-tender to palpation, non raised bilaterally, no ulceration, no laceration, no erythema  Occlusion: stable and repeatable    Eyes: Pupils are equal, round, and reactive to light.   Neck: Normal range of motion.   Pulmonary/Chest: Effort normal.   Neurological: patient is alert and oriented to person, place, and time. Gait normal. GCS score is 15.     Diagnostic Results:    Condyles seated in fossa bilaterally, sinuses are patent, no bony abnormalities noted.  Teeth #11, 12 decayed and broken. #12 has PARL.     Pathology Results:  N/A    Procedure:  Eval    Assessment:    ICD-10-CM ICD-9-CM   1. Pain, dental K08.89 525.9

## 2016-11-09 ENCOUNTER — Ambulatory Visit: Attending: Student in an Organized Health Care Education/Training Program

## 2016-11-09 DIAGNOSIS — K029 Dental caries, unspecified: Secondary | ICD-10-CM

## 2016-11-09 DIAGNOSIS — D649 Anemia, unspecified: Secondary | ICD-10-CM

## 2016-11-09 DIAGNOSIS — I10 Essential (primary) hypertension: Principal | ICD-10-CM

## 2016-11-09 DIAGNOSIS — K089 Disorder of teeth and supporting structures, unspecified: Principal | ICD-10-CM

## 2016-11-09 DIAGNOSIS — G8929 Other chronic pain: Secondary | ICD-10-CM

## 2016-11-09 MED ORDER — ACETAMINOPHEN-CODEINE 300-30 MG PO TABS
1 | ORAL_TABLET | ORAL | 0 refills | Status: CP | PRN
Start: 2016-11-09 — End: 2017-10-04

## 2016-11-09 MED ORDER — ACETAMINOPHEN 325 MG PO TABS
650 mg | ORAL | 0 refills | Status: CP | PRN
Start: 2016-11-09 — End: 2017-10-04

## 2016-11-09 MED ORDER — CHLORHEXIDINE GLUCONATE 0.12 % MT SOLN
15 mL | Freq: Two times a day (BID) | OROMUCOSAL | 0 refills | Status: CP
Start: 2016-11-09 — End: 2017-10-04

## 2016-11-10 NOTE — Progress Notes
Department of Oral and Maxillofacial Surgery       Chief Complaint   Patient presents with   ? Extraction     #11 & #12       Referring Physician:  Maxwell Caul, DMD    HPI:  Debbie Spencer is a 51 y.o. presents to our clinic today for extractions of teeth #11, 12. PMH significant for asthma, HTN. Pt complains of tenderness and sharp pain around teeth #11, 12 causing difficulty eating for the last several weeks. Pt denies SOB/f/n/v/c. Pt denies dysphagia, dyspnea, fevers, chills, dizziness, headaches, chest pain, shortness of breath, nausea, vomiting.     Allergies:   Ibuprofen; Lisinopril; and No known drug allergy    MEDS  Current Outpatient Prescriptions:   ?  amLODIPine (NORVASC) 10 MG PO Tablet, Take 1 tablet by mouth daily. - for blood pressure., Disp: 30 tablet, Rfl: 7  ?  bisoprolol-hydroCHLOROthiazide (ZIAC) 5-6.25 MG PO Tablet, TAKE 1 TABLET BY MOUTH DAILY, Disp: 90 tablet, Rfl: 0  ?  docusate sodium (COLACE) 100 MG Capsule, Take 1 capsule by mouth 2 times daily as needed for constipation., Disp: 60 capsule, Rfl: 6  ?  ferrous sulfate 325 (65 FE) MG tablet, Take 1 tablet by mouth 3 times daily (with meals). Take 2 hours prior to, or 4 hours after antacids., take with orange juice, Disp: 90 tablet, Rfl: 3  ?  meloxicam (MOBIC) 15 MG PO Tablet, TAKE 1 TABLET BY MOUTH DAILY AS NEEDED, Disp: 30 tablet, Rfl: 1  ?  PROAIR HFA 108 (90 Base) MCG/ACT IN Aerosol Solution, Inhale 1 puff every 6 hours as needed for wheezing., Disp: 8.5 g, Rfl: 1  ?  tiZANidine (ZANAFLEX) 4 MG PO Tablet, Take 1 tablet by mouth every 8 hours as needed (muscle cramp)., Disp: 15 tablet, Rfl: 0  ?  acetaminophen (TYLENOL) 325 MG PO Tablet, Take 2 tablets by mouth every 4 hours as needed for pain., Disp: 60 tablet, Rfl: 0  ?  acetaminophen-codeine (TYLENOL #3) 300-30 MG PO Tablet, Take 1 tablet by mouth every 4 hours as needed., Disp: 18 tablet, Rfl: 0  ?  chlorhexidine (PERIDEX) 0.12 % MT Solution, Take 15 mLs by mouth 2

## 2016-11-10 NOTE — Progress Notes
times daily for 7 days. Rinse mouth with 15 mL (1 capful) for 30 seconds AM and PM after toothbrushing. Spit out after rinsing, do not swallow., Disp: 473 mL, Rfl: 0     Patient Active Problem List   Diagnosis   ? Dysthymic disorder   ? Dental caries   ? Allergic rhinitis, cause unspecified   ? Obesity, unspecified   ? Unspecified asthma(493.90)   ? Viral warts, unspecified   ? Anemia   ? Carpal tunnel syndrome   ? Degenerative disc disease, cervical   ? Primary osteoarthritis of right knee   ? Osteoarthritis of acromioclavicular joint   ? Idiopathic hypertension   ? Pain, dental       Past Medical History:   Diagnosis Date   ? Anemia    ? Hypertension         Past Surgical History:   Procedure Laterality Date   ? CESAREAN SECTION      Surgery Description: Cesarean Section;  Problem Comments: - 1985 (Created by Conversion)        Social History     Social History   ? Marital status: Divorced     Spouse name: N/A   ? Number of children: N/A   ? Years of education: N/A     Occupational History   ? Not on file.     Social History Main Topics   ? Smoking status: Never Smoker   ? Smokeless tobacco: Never Used   ? Alcohol use No   ? Drug use: No   ? Sexual activity: Yes     Partners: Male     Other Topics Concern   ? Not on file     Social History Narrative   ? No narrative on file       Family History   Problem Relation Age of Onset   ? Breast Cancer Neg Hx    ? Ovarian Cancer Neg Hx    ? Colon Cancer Neg Hx    ? Endometrial Cancer Neg Hx    ? Cancer Neg Hx    ? Li-Fraumeni Syndrome Neg Hx    ? Cowden Syndrome Neg Hx    ? DES Usage Neg Hx    ? BRCA 1/2 Neg Hx        ROS:  Constitutional: Negative for chills, fever and weight loss.   HEENT: Negative for congestion, ear pain, hearing loss, nosebleeds and sinus pain.    Eyes: Negative for blurred vision and double vision.   Mouth/Throat: (+) Pain, (-) swelling, trismus, malocclusion  Respiratory: Negative for cough and shortness of breath.

## 2016-11-10 NOTE — Progress Notes
Cardiovascular: Negative for chest pain and palpitations.   Gastrointestinal: Negative for diarrhea, nausea and vomiting.   Musculoskeletal: Negative for joint pain and neck pain.   Skin: Negative for rash.   Neurological: Negative for dizziness, speech change, seizures and headaches.       EXAM:  Vitals:    11/09/16 0932   BP: (!) 157/97   Pulse: 70   Resp: 20   Temp: 36.8 ?C (98.3 ?F)   Weight: 104.3 kg (230 lb)   Height: 1.651 m (5\' 5" )      Body mass index is 38.27 kg/m?[name]    Constitutional: well-developed, well-nourished, and in no distress.  Neurological: AAOx4  HEENT:   There are no visible lacerations, abrasions, or lesions  No signs of facial swelling present  Negative for TTP  Intra-Oral  Grossly decayed teeth #11, 12  No vestibular swelling or purulent discharge  Full Range of opening, negative for trismus  No intra-oral abscesses visible  Generalized caries    Imaging:  Diagnostic Results:    Condyles seated in fossa bilaterally, sinuses are patent, no bony abnormalities noted.  Teeth #11, 12 decayed and broken. #12 has PARL.       Procedure:  Consent was given by the patient. 2 carpules of 1.8 cc lidocaine HCL 2% with 1:100,000 epinephrine administered. PDL around teeth separated with periosteal elevator followed by luxation with elevators and atraumatic extraction of teeth #11, 12 using forceps. Gauze was placed over the extraction site for hemostasis.         Codes [phone]    Assessment:    ICD-10-CM ICD-9-CM   1. Chronic dental pain K08.9 525.9    G89.29 338.29   2. Dental caries K02.9 521.00      Debbie Spencer is a 51 y.o. presents with symptomatic grossly decayed teeth #11, 12.      Plan:  - Extraction of teeth #11, 12 under local anesthesia.   - Follow up PRN  - Rx:  Orders Placed This Encounter   Medications   ? acetaminophen-codeine (TYLENOL #3) 300-30 MG PO Tablet     Sig: Take 1 tablet by mouth every 4 hours as needed.     Dispense:  18 tablet     Refill:  0

## 2016-11-10 NOTE — Progress Notes
Order Specific Question:   Prior to prescribing this controlled substance, has the [place] PDMP website been accessed by yourself or designated staff?     Answer:   Yes, The PDMP website has been accessed   ? chlorhexidine (PERIDEX) 0.12 % MT Solution     Sig: Take 15 mLs by mouth 2 times daily for 7 days. Rinse mouth with 15 mL (1 capful) for 30 seconds AM and PM after toothbrushing. Spit out after rinsing, do not swallow.     Dispense:  473 mL     Refill:  0   ? acetaminophen (TYLENOL) 325 MG PO Tablet     Sig: Take 2 tablets by mouth every 4 hours as needed for pain.     Dispense:  60 tablet     Refill:  0               Barth Kirks. Abdelmalik, DDS  11/10/2016 6:04 AM

## 2016-11-12 NOTE — Progress Notes
TResidenteaching/Attestation Statement  I discussed this patient with the Resident and agree with the findings and plan as documented in the note. I was on the premises and available. I agree with the medical decision making as documented with the following addidtions/exce-on: -

## 2016-12-18 DIAGNOSIS — J4520 Mild intermittent asthma, uncomplicated: Principal | ICD-10-CM

## 2016-12-18 MED ORDER — PROAIR HFA 108 (90 BASE) MCG/ACT IN AERS
1 | Freq: Four times a day (QID) | RESPIRATORY_TRACT | 1 refills | Status: CP | PRN
Start: 2016-12-18 — End: 2017-10-04

## 2016-12-22 ENCOUNTER — Ambulatory Visit: Attending: Family Medicine

## 2016-12-22 DIAGNOSIS — M1990 Unspecified osteoarthritis, unspecified site: Secondary | ICD-10-CM

## 2016-12-22 DIAGNOSIS — I10 Essential (primary) hypertension: Principal | ICD-10-CM

## 2016-12-22 DIAGNOSIS — M79672 Pain in left foot: Secondary | ICD-10-CM

## 2016-12-22 DIAGNOSIS — M779 Enthesopathy, unspecified: Secondary | ICD-10-CM

## 2016-12-22 DIAGNOSIS — G709 Myoneural disorder, unspecified: Secondary | ICD-10-CM

## 2016-12-22 DIAGNOSIS — M79671 Pain in right foot: Principal | ICD-10-CM

## 2016-12-22 DIAGNOSIS — M7731 Calcaneal spur, right foot: Secondary | ICD-10-CM

## 2016-12-22 DIAGNOSIS — M7732 Calcaneal spur, left foot: Secondary | ICD-10-CM

## 2016-12-22 DIAGNOSIS — D649 Anemia, unspecified: Secondary | ICD-10-CM

## 2016-12-22 MED ORDER — AMOXICILLIN 500 MG PO CAPS
0 refills
Start: 2016-12-22 — End: 2017-10-04

## 2016-12-22 MED ORDER — PENICILLIN V POTASSIUM 500 MG PO TABS
0 refills
Start: 2016-12-22 — End: 2017-10-04

## 2016-12-22 MED ORDER — HYDROCODONE-ACETAMINOPHEN 7.5-325 MG PO TABS
1 | ORAL_TABLET | ORAL | 0 refills | PRN
Start: 2016-12-22 — End: 2017-10-04

## 2016-12-22 MED ORDER — METHYLPREDNISOLONE 4 MG PO TBPK
0 refills | Status: CP
Start: 2016-12-22 — End: 2017-10-04

## 2016-12-22 NOTE — Patient Instructions
?   Consider finding low-impact activities if you continue to have problems.  ? Lose weight if you need to.  The best way to prevent plantar fasciitis is to avoid the activities that aggravate your plantar fascia.  Contact a health care provider if:  ? Your symptoms do not go away after treatment with home care measures.  ? Your pain gets worse.  ? Your pain affects your ability to move or do your daily activities.  This information is not intended to replace advice given to you by your health care provider. Make sure you discuss any questions you have with your health care provider.  Document Released: 10/21/2000 Document Revised: 07/01/2015 Document Reviewed: 12/06/2013  Elsevier Interactive Patient Education ? 2018 [place].

## 2016-12-22 NOTE — Patient Instructions
?   Ask your health care provider if you should use ice or cold packs on the painful areas of your heel or foot.  ? Avoid activities that cause you pain until you recover or as directed by your health care provider.  ? Stretch before exercising or being physically active.  ? Wear supportive shoes that fit well as directed by your health care provider. You might need to buy new shoes. Wearing old shoes or shoes that do not fit correctly may not provide the support that you need.  ? Lose weight if your health care provider thinks you should. This can relieve pressure on your foot that may be causing pain and discomfort.  Contact a health care provider if:  ? Your pain continues or gets worse.  This information is not intended to replace advice given to you by your health care provider. Make sure you discuss any questions you have with your health care provider.  Document Released: 03/04/2005 Document Revised: 07/04/2015 Document Reviewed: 03/29/2013  Elsevier Interactive Patient Education ? 2018 [place].    Plantar Fasciitis Rehab  Ask your health care provider which exercises are safe for you. Do exercises exactly as told by your health care provider and adjust them as directed. It is normal to feel mild stretching, pulling, tightness, or discomfort as you do these exercises, but you should stop right away if you feel sudden pain or your pain gets worse. Do not begin these exercises until told by your health care provider.  Stretching and range of motion exercises  These exercises warm up your muscles and joints and improve the movement and flexibility of your foot. These exercises also help to relieve pain.  Exercise A: Plantar fascia stretch    1. Sit with your left / right leg crossed over your opposite knee.  2. Hold your heel with one hand with that thumb near your arch. With your other hand, hold your toes and gently pull them back toward the top of

## 2016-12-22 NOTE — Patient Instructions
your foot. You should feel a stretch on the bottom of your toes or your foot or both.  3. Hold this stretch for__________ seconds.  4. Slowly release your toes and return to the starting position.  Repeat __________ times. Complete this exercise __________ times a day.  Exercise B: Gastroc, standing    1. Stand with your hands against a wall.  2. Extend your left / right leg behind you, and bend your front knee slightly.  3. Keeping your heels on the floor and keeping your back knee straight, shift your weight toward the wall without arching your back. You should feel a gentle stretch in your left / right calf.  4. Hold this position for __________ seconds.  Repeat __________ times. Complete this exercise __________ times a day.  Exercise C: Soleus, standing  1. Stand with your hands against a wall.  2. Extend your left / right leg behind you, and bend your front knee slightly.  3. Keeping your heels on the floor, bend your back knee and slightly shift your weight over the back leg. You should feel a gentle stretch deep in your calf.  4. Hold this position for __________ seconds.  Repeat __________ times. Complete this exercise __________ times a day.  Exercise D: Gastrocsoleus, standing  1. Stand with the ball of your left / right foot on a step. The ball of your foot is on the walking surface, right under your toes.  2. Keep your other foot firmly on the same step.  3. Hold onto the wall or a railing for balance.  4. Slowly lift your other foot, allowing your body weight to press your heel down over the edge of the step. You should feel a stretch in your left / right calf.  5. Hold this position for __________ seconds.  6. Return both feet to the step.  7. Repeat this exercise with a slight bend in your left / right knee.  Repeat __________ times with your left / right knee straight and __________ times with your left / right knee bent. Complete this exercise __________ times a day.  Balance exercise

## 2016-12-22 NOTE — Patient Instructions
Heel Spur  A heel spur is a bony growth that forms on the bottom of your heel bone (calcaneus). Heel spurs are common and do not always cause pain. However, heel spurs often cause inflammation in the strong band of tissue that runs underneath the bone of your foot (plantar fascia). When this happens, you may feel pain on the bottom of your foot, near your heel.  What are the causes?  The cause of heel spurs is not completely understood. They may be caused by pressure on the heel. Or, they may stem from the muscle attachments (tendons) near the spur pulling on the heel.  What increases the risk?  You may be at risk for a heel spur if you:  ? Are older than 40.  ? Are overweight.  ? Have wear and tear arthritis (osteoarthritis).  ? Have plantar fascia inflammation.    What are the signs or symptoms?  Some people have heel spurs but no symptoms. If you do have symptoms, they may include:  ? Pain in the bottom of your heel.  ? Pain that is worse when you first get out of bed.  ? Pain that gets worse after walking or standing.    How is this diagnosed?  Your health care provider may diagnose a heel spur based on your symptoms and a physical exam. You may also have an X-ray of your foot to check for a bony growth coming from the calcaneus.  How is this treated?  Treatment aims to relieve the pain from the heel spur. This may include:  ? Stretching exercises.  ? Losing weight.  ? Wearing specific shoes, inserts, or orthotics for comfort and support.  ? Wearing splints at night to properly position your feet.  ? Taking over-the-counter medicine to relieve pain.  ? Being treated with high-intensity sound waves to break up the heel spur (extracorporeal shock wave therapy).  ? Getting steroid injections in your heel to reduce swelling and ease pain.  ? Having surgery if your heel spur causes long-term (chronic) pain.    Follow these instructions at home:  ? Take medicines only as directed by your health care provider.

## 2016-12-22 NOTE — Patient Instructions
This exercise builds your balance and strength control of your arch to help take pressure off your plantar fascia.  Exercise E: Single leg stand  1. Without shoes, stand near a railing or in a doorway. You may hold onto the railing or door frame as needed.  2. Stand on your left / right foot. Keep your big toe down on the floor and try to keep your arch lifted. Do not let your foot roll inward.  3. Hold this position for __________ seconds.  4. If this exercise is too easy, you can try it with your eyes closed or while standing on a pillow.  Repeat __________ times. Complete this exercise __________ times a day.  This information is not intended to replace advice given to you by your health care provider. Make sure you discuss any questions you have with your health care provider.  Document Released: 01/26/2005 Document Revised: 10/01/2015 Document Reviewed: 12/10/2014  Elsevier Interactive Patient Education ? 2018 [employer]    Plantar Fasciitis  Plantar fasciitis is a painful foot condition that affects the heel. It occurs when the band of tissue that connects the toes to the heel bone (plantar fascia) becomes irritated. This can happen after exercising too much or doing other repetitive activities (overuse injury). The pain from plantar fasciitis can range from mild irritation to severe pain that makes it difficult for you to walk or move. The pain is usually worse in the morning or after you have been sitting or lying down for a while.  What are the causes?  This condition may be caused by:  ? Standing for long periods of time.  ? Wearing shoes that do not fit.  ? Doing high-impact activities, including running, aerobics, and ballet.  ? Being overweight.  ? Having an abnormal way of walking (gait).  ? Having tight calf muscles.  ? Having high arches in your feet.  ? Starting a new athletic activity.    What are the signs or symptoms?  The main symptom of this condition is heel pain. Other symptoms include:

## 2016-12-22 NOTE — Patient Instructions
?   Pain that gets worse after activity or exercise.  ? Pain that is worse in the morning or after resting.  ? Pain that goes away after you walk for a few minutes.    How is this diagnosed?  This condition may be diagnosed based on your signs and symptoms. Your health care provider will also do a physical exam to check for:  ? A tender area on the bottom of your foot.  ? A high arch in your foot.  ? Pain when you move your foot.  ? Difficulty moving your foot.    You may also need to have imaging studies to confirm the diagnosis. These can include:  ? X-rays.  ? Ultrasound.  ? MRI.    How is this treated?  Treatment for plantar fasciitis depends on the severity of the condition. Your treatment may include:  ? Rest, ice, and over-the-counter pain medicines to manage your pain.  ? Exercises to stretch your calves and your plantar fascia.  ? A splint that holds your foot in a stretched, upward position while you sleep (night splint).  ? Physical therapy to relieve symptoms and prevent problems in the future.  ? Cortisone injections to relieve severe pain.  ? Extracorporeal shock wave therapy (ESWT) to stimulate damaged plantar fascia with electrical impulses. It is often used as a last resort before surgery.  ? Surgery, if other treatments have not worked after 12 months.    Follow these instructions at home:  ? Take medicines only as directed by your health care provider.  ? Avoid activities that cause pain.  ? Roll the bottom of your foot over a bag of ice or a bottle of cold water. Do this for 20 minutes, 3?4 times a day.  ? Perform simple stretches as directed by your health care provider.  ? Try wearing athletic shoes with air-sole or gel-sole cushions or soft shoe inserts.  ? Wear a night splint while sleeping, if directed by your health care provider.  ? Keep all follow-up appointments with your health care provider.  How is this prevented?  ? Do not perform exercises or activities that cause heel pain.

## 2016-12-23 NOTE — Progress Notes
?   ferrous sulfate 325 (65 FE) MG tablet Take 1 tablet by mouth 3 times daily (with meals). Take 2 hours prior to, or 4 hours after antacids., take with orange juice   ? meloxicam (MOBIC) 15 MG PO Tablet TAKE 1 TABLET BY MOUTH DAILY AS NEEDED   ? PROAIR HFA 108 (90 Base) MCG/ACT IN Aerosol Solution Inhale 1 puff every 6 hours as needed for wheezing.   ? tiZANidine (ZANAFLEX) 4 MG PO Tablet Take 1 tablet by mouth every 8 hours as needed (muscle cramp).   ? acetaminophen (TYLENOL) 325 MG PO Tablet Take 2 tablets by mouth every 4 hours as needed for pain.   ? acetaminophen-codeine (TYLENOL #3) 300-30 MG PO Tablet Take 1 tablet by mouth every 4 hours as needed.   ? chlorhexidine (PERIDEX) 0.12 % MT Solution Take 15 mLs by mouth 2 times daily for 7 days. Rinse mouth with 15 mL (1 capful) for 30 seconds AM and PM after toothbrushing. Spit out after rinsing, do not swallow.     No current facility-administered medications on file prior to visit.      Allergies   Allergen Reactions   ? Ibuprofen Anaphylaxis   ? Lisinopril Other (See Comments)     Cough   ? No Known Drug Allergy      No Known Drug Allergy         Review of Systems  Review of Systems        Objective:        VITAL SIGNS (all recorded)      [place] Name 12/22/16 1525                Amb Encounter Vitals    Weight 103 kg (227 lb)    -DP at 12/22/16 1539       Height 1.651 m (5\' 5" )    -DP at 12/22/16 1539       BMI (Calculated) 37.85    -DP at 12/22/16 1539       BSA (Calculated - sq m) 2.17    -DP at 12/22/16 1539       BP 133/88    -DP at 12/22/16 1539       BP Location Right upper arm    -DP at 12/22/16 1539       Position Sitting    -DP at 12/22/16 1539       Pulse 66    -DP at 12/22/16 1539       Resp 18    -DP at 12/22/16 1539       Temp 36.2 ?C (97.2 ?F)    -DP at 12/22/16 1539       Temperature Source Oral    -DP at 12/22/16 1539       Pain Score SIX    -DP at 12/22/16 1539       Location Heel on right foot    -DP at 12/22/16 1539

## 2016-12-23 NOTE — Progress Notes
Subjective:   Nickcole Bralley is a 51 y.o. female being seen today for Foot Injury (BL heel pain)       HPI     Heel pain BL since accident in 2003 when felll on slippery floor. On/off.   Alleviating - warm compresses help sometimes.  Worse - standing, worse in the AM;   Has to stand for 3 minutes before can move  Feels stiff, if takes one step than feels like can fall, no falls however   No numbness no tingling. Pain quality more sharp, not burning.   Does not spread anteriorly along foot; localized to heels.     Past Medical History:   Diagnosis Date   ? Anemia    ? Arthritis    ? Hypertension    ? Neuromuscular disorder (CMS-HCC code)      Past Surgical History:   Procedure Laterality Date   ? CESAREAN SECTION      Surgery Description: Cesarean Section;  Problem Comments: - 1985 (Created by Conversion)     Family History   Problem Relation Age of Onset   ? Breast Cancer Neg Hx    ? Ovarian Cancer Neg Hx    ? Colon Cancer Neg Hx    ? Endometrial Cancer Neg Hx    ? Cancer Neg Hx    ? Li-Fraumeni Syndrome Neg Hx    ? Cowden Syndrome Neg Hx    ? DES Usage Neg Hx    ? BRCA 1/2 Neg Hx      Social History     Social History   ? Marital status: Divorced     Spouse name: N/A   ? Number of children: N/A   ? Years of education: N/A     Occupational History   ? Not on file.     Social History Main Topics   ? Smoking status: Never Smoker   ? Smokeless tobacco: Never Used   ? Alcohol use No   ? Drug use: No   ? Sexual activity: Yes     Partners: Male     Other Topics Concern   ? Not on file     Social History Narrative   ? No narrative on file     Current Outpatient Prescriptions on File Prior to Visit   Medication Sig   ? amLODIPine (NORVASC) 10 MG PO Tablet Take 1 tablet by mouth daily. - for blood pressure.   ? bisoprolol-hydroCHLOROthiazide (ZIAC) 5-6.25 MG PO Tablet TAKE 1 TABLET BY MOUTH DAILY   ? docusate sodium (COLACE) 100 MG Capsule Take 1 capsule by mouth 2 times daily as needed for constipation.

## 2016-12-23 NOTE — Progress Notes
Education/Communication Barriers?    Learning/Communication Barriers? No    -DP at 12/22/16 1539          Fall Risk Assessment    Had recent fall / Last 6 months? No recent fall    -DP at 12/22/16 1539       Does patient have a fear of falling? No    -DP at 12/22/16 1539         User Key  (r) = Recorded By, (t) = Taken By, (c) = Cosigned By    [place] Name Effective Dates    DP Pugh, Kem Kays, MA 05/16/15 -         Physical Exam     Physical Exam   GENERAL: Well-nourished, well-hydrated, no acute distress.  HEENT: Atraumatic, pupils and conjunctiva normal  NECK:  Supple, no masses, trachea midline.    LUNGS:  CTA, no wheezing, no ronchi. No labored breathing.  CARDIOVASCULAR:  RRR S1-S2, no murmurs, rubs, gallops. Periph pulses palpable, dorsalis pedis strong BL.  MUSCULOSKELETAL:  Moves all extremities well, no clubbing.  1+ BL pitting dependent edema. Heel tenderness BL localized to bony prominence plantar calcaneous. Left heel tenderness appears to extend into the medial arch but stops short of the navicular bone. No tinel sign with median plantar nerve.   No tenderness of flexer aponeurosis, no pain nor relief elicited with dorsiflexion stretch.  BL ankles full ROM. Right ankle fully stable. Left ankle stable but clicking with ROM appears localized to ankle mortisse at lateral malleolus. BL limp, however antalgic gait appears to favor the left.   SKIN:  No rashes or suspicious lesions.  NEUROLOGIC: Speech and affect normal. No sensory deficit. NVI.    Assessment:       ICD-10-CM ICD-9-CM    1. Heel pain, bilateral M79.671 729.5 methylPREDNISolone (MEDROL DOSEPAK) 4 MG PO Tablet Therapy Pack    M79.672  XR Foot Left 3 Views      XR Foot Right 3 Views      Refer to Podiatry   2. Heel spur, left M77.32 726.73 methylPREDNISolone (MEDROL DOSEPAK) 4 MG PO Tablet Therapy Pack      XR Foot Left 3 Views      Refer to Podiatry   3. Heel spur, right M77.31 726.73 methylPREDNISolone (MEDROL DOSEPAK) 4 MG

## 2016-12-23 NOTE — Progress Notes
PO Tablet Therapy Pack      XR Foot Right 3 Views      Refer to Podiatry   4. Enthesopathy of hindfoot M77.9 726.70           Plan:     Heel pain BL - chronic worsening - BL heel enthesopathy and/or spurs (working diagnoses) with some possible aspects of plantar fasciitis (morning stiffness), and possibly secondary to or exacerbated by 2003 injury. Patient is allergic to Ibuprofen so will avoid Naproxen and Rx Medrol dose pak.  Ordering BL foot x-rays (pt will return to [facility] for x-rays).   Referral to podiatry eval/treat.   Consider therapeutic injections in the future - with caution re corticosteroids and fat pad.   Literature re heel spurs and plantar fasciitis home exercises.   Left ankle clicks with passive ROM but does not appear significantly unstable.   Follow up in about 2 weeks.     Orders Placed This Encounter   Medications   ? methylPREDNISolone (MEDROL DOSEPAK) 4 MG PO Tablet Therapy Pack     Sig: Take by mouth. Follow package directions.     Dispense:  1 each     Refill:  0     Orders Placed This Encounter   Procedures   ? XR Foot Left 3 Views   ? XR Foot Right 3 Views   ? Refer to [place] Maintenance was reviewed. The patient's HM Topic list was:                                            Health Maintenance   Topic Date Due   ? DTaP,Tdap,and Td Vaccines (1 - Tdap) 01/02/1986   ? Preventive Wellness Visit  08/09/2014   ? Pap Smear  08/08/2016   ? Lipid Profile  06/29/2017   ? Basic Metabolic Panel  40/34/7425   ? Breast Cancer Screening Discussion  07/03/2017   ? USPSTF HIV Risk Assessment  Completed   ? Influenza Vaccine  Addressed       Return in about 2 weeks (around 01/05/2017), or if symptoms worsen or fail to improve.

## 2017-01-13 ENCOUNTER — Encounter: Attending: Family Medicine

## 2017-01-18 DIAGNOSIS — I10 Essential (primary) hypertension: Principal | ICD-10-CM

## 2017-01-19 MED ORDER — BISOPROLOL-HYDROCHLOROTHIAZIDE 5-6.25 MG PO TABS
1 | ORAL_TABLET | Freq: Every day | ORAL | 0 refills | Status: CP
Start: 2017-01-19 — End: 2017-09-23

## 2017-01-29 ENCOUNTER — Inpatient Hospital Stay: Admit: 2017-01-29 | Discharge: 2017-01-30

## 2017-01-29 ENCOUNTER — Ambulatory Visit: Admit: 2017-01-29 | Discharge: 2017-01-30 | Attending: Podiatrist

## 2017-01-29 DIAGNOSIS — M1990 Unspecified osteoarthritis, unspecified site: Secondary | ICD-10-CM

## 2017-01-29 DIAGNOSIS — M1711 Unilateral primary osteoarthritis, right knee: Secondary | ICD-10-CM

## 2017-01-29 DIAGNOSIS — M7731 Calcaneal spur, right foot: Secondary | ICD-10-CM

## 2017-01-29 DIAGNOSIS — Z886 Allergy status to analgesic agent status: Secondary | ICD-10-CM

## 2017-01-29 DIAGNOSIS — J45909 Unspecified asthma, uncomplicated: Secondary | ICD-10-CM

## 2017-01-29 DIAGNOSIS — M19019 Primary osteoarthritis, unspecified shoulder: Secondary | ICD-10-CM

## 2017-01-29 DIAGNOSIS — G709 Myoneural disorder, unspecified: Secondary | ICD-10-CM

## 2017-01-29 DIAGNOSIS — M7732 Calcaneal spur, left foot: Secondary | ICD-10-CM

## 2017-01-29 DIAGNOSIS — M79671 Pain in right foot: Secondary | ICD-10-CM

## 2017-01-29 DIAGNOSIS — I10 Essential (primary) hypertension: Principal | ICD-10-CM

## 2017-01-29 DIAGNOSIS — M79672 Pain in left foot: Secondary | ICD-10-CM

## 2017-01-29 DIAGNOSIS — Z791 Long term (current) use of non-steroidal anti-inflammatories (NSAID): Secondary | ICD-10-CM

## 2017-01-29 DIAGNOSIS — D649 Anemia, unspecified: Secondary | ICD-10-CM

## 2017-01-29 DIAGNOSIS — M5030 Other cervical disc degeneration, unspecified cervical region: Secondary | ICD-10-CM

## 2017-01-29 DIAGNOSIS — Z6838 Body mass index (BMI) 38.0-38.9, adult: Secondary | ICD-10-CM

## 2017-01-29 DIAGNOSIS — M21612 Bunion of left foot: Secondary | ICD-10-CM

## 2017-01-29 DIAGNOSIS — Z888 Allergy status to other drugs, medicaments and biological substances status: Secondary | ICD-10-CM

## 2017-01-29 DIAGNOSIS — E669 Obesity, unspecified: Secondary | ICD-10-CM

## 2017-01-29 DIAGNOSIS — M21611 Bunion of right foot: Secondary | ICD-10-CM

## 2017-01-29 DIAGNOSIS — Z79899 Other long term (current) drug therapy: Secondary | ICD-10-CM

## 2017-01-29 NOTE — Progress Notes
IMMEDIATELY THEN TAKE ONE CAPSULE EVERY 8 HOURS THEREAFTER UNTIL FINISHED  0   ? bisoprolol-hydroCHLOROthiazide (ZIAC) 5-6.25 MG PO Tablet Take 1 tablet by mouth daily. 90 tablet 0   ? ferrous sulfate 325 (65 FE) MG tablet Take 1 tablet by mouth 3 times daily (with meals). Take 2 hours prior to, or 4 hours after antacids., take with orange juice 90 tablet 3   ? HYDROcodone-acetaminophen (NORCO) 7.5-325 MG PO Tablet Take 1 tablet by mouth every 4 to 6 hours as needed.  0   ? PROAIR HFA 108 (90 Base) MCG/ACT IN Aerosol Solution Inhale 1 puff every 6 hours as needed for wheezing. 8.5 g 1   ? acetaminophen (TYLENOL) 325 MG PO Tablet Take 2 tablets by mouth every 4 hours as needed for pain. 60 tablet 0   ? acetaminophen-codeine (TYLENOL #3) 300-30 MG PO Tablet Take 1 tablet by mouth every 4 hours as needed. 18 tablet 0   ? amLODIPine (NORVASC) 10 MG PO Tablet Take 1 tablet by mouth daily. - for blood pressure. 30 tablet 7   ? chlorhexidine (PERIDEX) 0.12 % MT Solution Take 15 mLs by mouth 2 times daily for 7 days. Rinse mouth with 15 mL (1 capful) for 30 seconds AM and PM after toothbrushing. Spit out after rinsing, do not swallow. 473 mL 0   ? docusate sodium (COLACE) 100 MG Capsule Take 1 capsule by mouth 2 times daily as needed for constipation. 60 capsule 6   ? meloxicam (MOBIC) 15 MG PO Tablet TAKE 1 TABLET BY MOUTH DAILY AS NEEDED 30 tablet 1   ? methylPREDNISolone (MEDROL DOSEPAK) 4 MG PO Tablet Therapy Pack Take by mouth. Follow package directions. 1 each 0   ? penicillin v potassium (VEETID) 500 MG PO Tablet TAKE 1 TABLET BY MOUTH FOUR TIMES A DAY FOR 10 DAYS  0   ? tiZANidine (ZANAFLEX) 4 MG PO Tablet Take 1 tablet by mouth every 8 hours as needed (muscle cramp). 15 tablet 0     No current facility-administered medications for this visit.        Active Problems:  Patient Active Problem List   Diagnosis   ? Dysthymic disorder   ? Dental caries   ? Allergic rhinitis, cause unspecified   ? Obesity, unspecified

## 2017-01-29 NOTE — Progress Notes
?   Unspecified asthma(493.90)   ? Viral warts, unspecified   ? Anemia   ? Carpal tunnel syndrome   ? Degenerative disc disease, cervical   ? Primary osteoarthritis of right knee   ? Osteoarthritis of acromioclavicular joint   ? Idiopathic hypertension   ? Pain, dental       PMH:  Past Medical History:   Diagnosis Date   ? Anemia    ? Arthritis    ? Hypertension    ? Neuromuscular disorder (CMS-HCC code)        PSH:  Past Surgical History:   Procedure Laterality Date   ? CESAREAN SECTION      Surgery Description: Cesarean Section;  Problem Comments: - 1985 (Created by Conversion)       Social Hx:  Social History     Social History   ? Marital status: Divorced     Spouse name: N/A   ? Number of children: N/A   ? Years of education: N/A     Occupational History   ? Not on file.     Social History Main Topics   ? Smoking status: Never Smoker   ? Smokeless tobacco: Never Used   ? Alcohol use No   ? Drug use: No   ? Sexual activity: Yes     Partners: Male     Other Topics Concern   ? Not on file     Social History Narrative   ? No narrative on file        Family Hx:  Family History   Problem Relation Age of Onset   ? Breast Cancer Neg Hx    ? Ovarian Cancer Neg Hx    ? Colon Cancer Neg Hx    ? Endometrial Cancer Neg Hx    ? Cancer Neg Hx    ? Li-Fraumeni Syndrome Neg Hx    ? Cowden Syndrome Neg Hx    ? DES Usage Neg Hx    ? BRCA 1/2 Neg Hx        ROS:   Constitutional: denies fever, chills, or night sweats  HEENT: denies headaches, visual disturbance, or changes in hearing  Respiratory: denies cough, shortness of breath  Cardiovascular: denies chest pain or palpitations  GI: denies nausea, vomiting, diarrhea, constipation, anorexia, and weight loss or gain  Neurological: denies numbness, weakness,   GU: denies dysuria, hematuria, and increase or decrease in urination  Integumentary: denies rashes or other lesions  Psychiatric: denies changes in mood or affect      Vital Signs:     VITAL SIGNS (all recorded)

## 2017-01-29 NOTE — Progress Notes
No open wounds, fissures or macerations noted.  Nails: WNL length and thickness.  Hyperkeratotic lesions: noone   Neuro: Epicritic sense of light touch intact Bilateral to tibial, deep peroneal, superficial peroneal, sural, and saphenous distributions     Test Conclusion:  Per Radiology: Xr Foot Left 3 Views    Result Date: 01/29/2017  3 views of the left foot were obtained without prior studies for comparison. No fractures or dislocations are seen. There is a small bunion deformity and mild calcaneal spurring. The soft tissues are normal.     Small bunion deformity and mild calcaneal spurring. Read By Lyndle Herrlich M.D.  Electronically Verified By - Lyndle Herrlich M.D.  Released Date Time - 01/29/2017 2:46 PM  Resident -     Per Radiology: [place] Foot Right 3 Views    Result Date: 01/29/2017  3 views of the right foot were obtained without prior studies for comparison. No fractures or dislocations are seen. Joint spaces are maintained. There is minimal calcaneal spurring. There is a small bunion deformity. The soft tissues are normal.     Small bunion deformity and minimal calcaneal spurring. Read By Lyndle Herrlich M.D.  Electronically Verified By - Lyndle Herrlich M.D.  Released Date Time - 01/29/2017 2:45 PM  Resident -       Procedure:  n/a    Patient Counseling & Education:  Plantar fasciitis & baxters neuritis education and treatment

## 2017-01-29 NOTE — Progress Notes
[place] Name 01/29/17 1340                Amb Encounter Vitals    Weight 104.5 kg (230 lb 4.8 oz)    -VB at 01/29/17 1342       Height 1.651 m (5\' 5" )    -VB at 01/29/17 1342       BMI (Calculated) 38.4    -VB at 01/29/17 1342       BSA (Calculated - sq m) 2.19    -VB at 01/29/17 1342       BP (!)  171/99    -VB at 01/29/17 1342       BP Location Right upper arm    -VB at 01/29/17 1342       Position Sitting    -VB at 01/29/17 1342       Pulse 74    -VB at 01/29/17 1342       Pulse Source Brachial    -VB at 01/29/17 1342       Pulse Quality Normal    -VB at 01/29/17 1342       Resp 17    -VB at 01/29/17 1342       Respiration Quality Normal    -VB at 01/29/17 1342       Temp 36 ?C (96.8 ?F)    -VB at 01/29/17 1342       Temperature Source Oral    -VB at 01/29/17 1342       Pain Score NINE    -VB at 01/29/17 1342          Education/Communication Barriers?    Learning/Communication Barriers? No    -VB at 01/29/17 1342          Fall Risk Assessment    Had recent fall / Last 6 months? No recent fall    -VB at 01/29/17 1342       Does patient have a fear of falling? No    -VB at 01/29/17 1342         User Key  (r) = Recorded By, (t) = Taken By, (c) = Cosigned By    [place] Name Effective Dates    VB Gearlean Alf, LPN 21/30/86 -           Podiatric Physical Exam:   General :   A&O x3 in NAD.   Vascular: DP pulse present 2+/4 and PT pulse present 2+/4 Bilateral.   Capillary refill time less than 3 seconds to all digits tested.   Feet temperature WNL.   (+) hair growth.  (negative) edema.    Ortho:  No history of amputation.   No gross deformity about the feet.   Muscle strength 5/5 bilat. Active EHL, FHL, TA, GSC bilat.  Pain on palpation about the lateral calcaneal tubercle, no tenderness to medial calcaneal tubercle, otherwise neg for pain on palpation or ROM.   All compartments soft and compressible for foot and LE.    + motor abductor digiti minimi   Derm: Skin is warm, dry and supple.

## 2017-01-29 NOTE — Patient Instructions
Repeat 3 times. Complete this exercise 3-4 times a day.  Exercise D: Gastrocsoleus, standing  1. Stand with the ball of your left / right foot on a step. The ball of your foot is on the walking surface, right under your toes.  2. Keep your other foot firmly on the same step.  3. Hold onto the wall or a railing for balance.  4. Slowly lift your other foot, allowing your body weight to press your heel down over the edge of the step. You should feel a stretch in your left / right calf.  5. Hold this position for 10-20 seconds.  6. Return both feet to the step.  7. Repeat this exercise with a slight bend in your left / right knee.  Repeat 3 times with your left / right knee straight and 3 times with your left / right knee bent. Complete this exercise 3-4 times a day.  Balance exercise  This exercise builds your balance and strength control of your arch to help take pressure off your plantar fascia.  Exercise E: Single leg stand  1. Without shoes, stand near a railing or in a doorway. You may hold onto the railing or door frame as needed.  2. Stand on your left / right foot. Keep your big toe down on the floor and try to keep your arch lifted. Do not let your foot roll inward.  3. Hold this position for 10-20 seconds.  4. If this exercise is too easy, you can try it with your eyes closed or while standing on a pillow.  Repeat 3 times. Complete this exercise 3-4 times a day.  This information is not intended to replace advice given to you by your health care provider. Make sure you discuss any questions you have with your health care provider.  Document Released: 01/26/2005 Document Revised: 10/01/2015 Document Reviewed: 12/10/2014  Elsevier Interactive Patient Education ? 2018 [employer].

## 2017-01-29 NOTE — Progress Notes
Department of Orthopedics   Division Of Podiatric Medicine and Surgery   Outpatient Visit      Assessment:     ICD-10-CM ICD-9-CM   1. Bilateral foot pain M79.671 729.5    M79.672    2. Heel pain, bilateral M79.671 729.5    M79.672    3. Heel spur, left M77.32 726.73   4. Heel spur, right M77.31 726.73       Plan:   Patient examined and evaluated all questions answered to the patients satisfaction     Instruct patients on:  -Proper footwear and not going barefooted-even indoors  -Daily foot inspection-look between the toes and on the sole of the foot  -Prompt reporting of any foot lesions, discoloration, or swelling      Plantar Fasciitis  - Education on etiology and treatment  - Stretching exercises dispensed and demonstrated  - Rec OTC inserts  - Possible early signs baxters neuritis       RTC 6 weeks      Orders Placed This Encounter   Procedures   ? XR Foot Right 3 Views   ? XR Foot Left 3 Views     No orders of the following type(s) were placed in this encounter: Medications.     Return in about 6 weeks (around 03/12/2017).      Referring Physician/Primary Care Physician:  [name], MD    Chief Complaint:  Chief Complaint   Patient presents with   ? Foot Pain     Bilat       HPI:  51 y.o. female presents to clinic today for evaluation of right heel pain. Pain hurts worse with first few steps after prolonged periods of rest and with first few steps in morning. Has not tried any treatment for pain. Has had pain for 5 weeks. Describes pain as sharp in nature. Denies any injuries. No pain to left foot. Denies n/v/fc/sob. Denies any other pedal concerns       Allergies:  Allergies   Allergen Reactions   ? Ibuprofen Anaphylaxis   ? Lisinopril Other (See Comments)     Cough   ? No Known Drug Allergy      No Known Drug Allergy       Current Meds:  Current Outpatient Prescriptions   Medication Sig Dispense Refill   ? amoxicillin (AMOXIL) 500 MG PO Capsule TAKE 2 CAPSULES BY MOUTH

## 2017-01-29 NOTE — Patient Instructions
Plantar Fasciitis Rehab  Ask your health care provider which exercises are safe for you. Do exercises exactly as told by your health care provider and adjust them as directed. It is normal to feel mild stretching, pulling, tightness, or discomfort as you do these exercises, but you should stop right away if you feel sudden pain or your pain gets worse. Do not begin these exercises until told by your health care provider.  Stretching and range of motion exercises  These exercises warm up your muscles and joints and improve the movement and flexibility of your foot. These exercises also help to relieve pain.  Exercise A: Plantar fascia stretch    1. Sit with your left / right leg crossed over your opposite knee.  2. Hold your heel with one hand with that thumb near your arch. With your other hand, hold your toes and gently pull them back toward the top of your foot. You should feel a stretch on the bottom of your toes or your foot or both.  3. Hold this stretch for10-20 seconds.  4. Slowly release your toes and return to the starting position.  Repeat 3 times. Complete this exercise  times 3-4a day.  Exercise B: Gastroc, standing    1. Stand with your hands against a wall.  2. Extend your left / right leg behind you, and bend your front knee slightly.  3. Keeping your heels on the floor and keeping your back knee straight, shift your weight toward the wall without arching your back. You should feel a gentle stretch in your left / right calf.  4. Hold this position for 10-20 seconds.  Repeat 3 times. Complete this exercise 3-4 times a day.  Exercise C: Soleus, standing  1. Stand with your hands against a wall.  2. Extend your left / right leg behind you, and bend your front knee slightly.  3. Keeping your heels on the floor, bend your back knee and slightly shift your weight over the back leg. You should feel a gentle stretch deep in your calf.  4. Hold this position for 10-20 seconds.

## 2017-02-03 ENCOUNTER — Encounter: Attending: Family Medicine

## 2017-02-04 NOTE — Progress Notes
Teaching/Attestation Statement    I saw and evaluated the patient. I discussed with the resident and agree with resident?s findings and plan as documented in the resident?s note. I was present for the entire procedure.

## 2017-08-31 ENCOUNTER — Encounter: Attending: Physician Assistant

## 2017-09-23 DIAGNOSIS — I10 Essential (primary) hypertension: Principal | ICD-10-CM

## 2017-09-23 MED ORDER — BISOPROLOL-HYDROCHLOROTHIAZIDE 5-6.25 MG PO TABS
1 | ORAL_TABLET | Freq: Every day | ORAL | 0 refills | Status: CP
Start: 2017-09-23 — End: 2017-10-04

## 2017-09-23 MED ORDER — AMLODIPINE BESYLATE 10 MG PO TABS
10 mg | Freq: Every day | ORAL | 7 refills | Status: CP
Start: 2017-09-23 — End: 2017-10-04

## 2017-10-04 ENCOUNTER — Ambulatory Visit

## 2017-10-04 DIAGNOSIS — G5603 Carpal tunnel syndrome, bilateral upper limbs: Secondary | ICD-10-CM

## 2017-10-04 DIAGNOSIS — I10 Essential (primary) hypertension: Principal | ICD-10-CM

## 2017-10-04 DIAGNOSIS — Z113 Encounter for screening for infections with a predominantly sexual mode of transmission: Secondary | ICD-10-CM

## 2017-10-04 DIAGNOSIS — M1711 Unilateral primary osteoarthritis, right knee: Secondary | ICD-10-CM

## 2017-10-04 DIAGNOSIS — G709 Myoneural disorder, unspecified: Secondary | ICD-10-CM

## 2017-10-04 DIAGNOSIS — Z1231 Encounter for screening mammogram for malignant neoplasm of breast: Secondary | ICD-10-CM

## 2017-10-04 DIAGNOSIS — D649 Anemia, unspecified: Secondary | ICD-10-CM

## 2017-10-04 DIAGNOSIS — M1990 Unspecified osteoarthritis, unspecified site: Secondary | ICD-10-CM

## 2017-10-04 DIAGNOSIS — J4520 Mild intermittent asthma, uncomplicated: Secondary | ICD-10-CM

## 2017-10-04 DIAGNOSIS — D501 Sideropenic dysphagia: Secondary | ICD-10-CM

## 2017-10-04 DIAGNOSIS — Z6839 Body mass index (BMI) 39.0-39.9, adult: Principal | ICD-10-CM

## 2017-10-04 MED ORDER — TIZANIDINE HCL 4 MG PO TABS
4 mg | Freq: Three times a day (TID) | ORAL | 2 refills | Status: CP | PRN
Start: 2017-10-04 — End: 2018-01-06

## 2017-10-04 MED ORDER — FERROUS SULFATE 325 (65 FE) MG PO TABS-TBEC JX
325 mg | Freq: Every day | ORAL | 5 refills | Status: CP
Start: 2017-10-04 — End: ?

## 2017-10-04 MED ORDER — PROAIR HFA 108 (90 BASE) MCG/ACT IN AERS
1 | Freq: Four times a day (QID) | RESPIRATORY_TRACT | 11 refills | Status: CP | PRN
Start: 2017-10-04 — End: 2018-01-17

## 2017-10-04 MED ORDER — MELOXICAM 15 MG PO TABS
5 refills | Status: CP
Start: 2017-10-04 — End: 2018-01-06

## 2017-10-04 MED ORDER — BISOPROLOL-HYDROCHLOROTHIAZIDE 5-6.25 MG PO TABS
1 | ORAL_TABLET | Freq: Every day | ORAL | 5 refills | Status: CP
Start: 2017-10-04 — End: ?

## 2017-10-04 MED ORDER — AMLODIPINE BESYLATE 10 MG PO TABS
10 mg | Freq: Every day | ORAL | 5 refills | Status: CP
Start: 2017-10-04 — End: ?

## 2017-10-29 ENCOUNTER — Ambulatory Visit: Attending: Nurse Practitioner

## 2017-10-29 DIAGNOSIS — J4520 Mild intermittent asthma, uncomplicated: Secondary | ICD-10-CM

## 2017-10-29 DIAGNOSIS — D649 Anemia, unspecified: Secondary | ICD-10-CM

## 2017-10-29 DIAGNOSIS — Z5320 Procedure and treatment not carried out because of patient's decision for unspecified reasons: Principal | ICD-10-CM

## 2017-10-29 DIAGNOSIS — N946 Dysmenorrhea, unspecified: Secondary | ICD-10-CM

## 2017-11-23 ENCOUNTER — Encounter

## 2017-11-26 ENCOUNTER — Inpatient Hospital Stay: Admit: 2017-11-26 | Discharge: 2017-11-27

## 2017-11-26 ENCOUNTER — Encounter: Attending: Family Medicine

## 2017-11-26 DIAGNOSIS — Z1231 Encounter for screening mammogram for malignant neoplasm of breast: Principal | ICD-10-CM

## 2017-11-26 DIAGNOSIS — M1990 Unspecified osteoarthritis, unspecified site: Secondary | ICD-10-CM

## 2017-11-26 DIAGNOSIS — D649 Anemia, unspecified: Secondary | ICD-10-CM

## 2017-11-26 DIAGNOSIS — I10 Essential (primary) hypertension: Principal | ICD-10-CM

## 2017-11-26 DIAGNOSIS — J45909 Unspecified asthma, uncomplicated: Secondary | ICD-10-CM

## 2017-11-26 DIAGNOSIS — G709 Myoneural disorder, unspecified: Secondary | ICD-10-CM

## 2017-12-03 ENCOUNTER — Encounter: Attending: Family Medicine

## 2017-12-17 ENCOUNTER — Encounter: Attending: Family Medicine

## 2017-12-30 DIAGNOSIS — M1711 Unilateral primary osteoarthritis, right knee: Principal | ICD-10-CM

## 2017-12-31 ENCOUNTER — Encounter: Attending: Family Medicine

## 2018-01-05 MED ORDER — TIZANIDINE HCL 4 MG PO TABS
4 mg | Freq: Three times a day (TID) | ORAL | 2 refills | Status: CP | PRN
Start: 2018-01-05 — End: 2018-01-17

## 2018-01-05 MED ORDER — MELOXICAM 15 MG PO TABS
5 refills | Status: CP
Start: 2018-01-05 — End: 2018-01-17

## 2018-01-14 ENCOUNTER — Ambulatory Visit: Attending: Family Medicine

## 2018-01-14 DIAGNOSIS — Z1211 Encounter for screening for malignant neoplasm of colon: Secondary | ICD-10-CM

## 2018-01-14 DIAGNOSIS — J45909 Unspecified asthma, uncomplicated: Secondary | ICD-10-CM

## 2018-01-14 DIAGNOSIS — M1711 Unilateral primary osteoarthritis, right knee: Secondary | ICD-10-CM

## 2018-01-14 DIAGNOSIS — Z6837 Body mass index (BMI) 37.0-37.9, adult: Principal | ICD-10-CM

## 2018-01-14 DIAGNOSIS — G709 Myoneural disorder, unspecified: Secondary | ICD-10-CM

## 2018-01-14 DIAGNOSIS — J4520 Mild intermittent asthma, uncomplicated: Secondary | ICD-10-CM

## 2018-01-14 DIAGNOSIS — D649 Anemia, unspecified: Secondary | ICD-10-CM

## 2018-01-14 DIAGNOSIS — I10 Essential (primary) hypertension: Principal | ICD-10-CM

## 2018-01-14 DIAGNOSIS — D508 Other iron deficiency anemias: Secondary | ICD-10-CM

## 2018-01-14 DIAGNOSIS — M1990 Unspecified osteoarthritis, unspecified site: Secondary | ICD-10-CM

## 2018-01-16 MED ORDER — MELOXICAM 15 MG PO TABS
5 refills | Status: CP
Start: 2018-01-16 — End: ?

## 2018-01-16 MED ORDER — FERROUS SULFATE 325 (65 FE) MG PO TBEC
325 mg | Freq: Two times a day (BID) | ORAL | 2 refills | Status: CP
Start: 2018-01-16 — End: ?

## 2018-01-16 MED ORDER — PROAIR HFA 108 (90 BASE) MCG/ACT IN AERS
1 | Freq: Four times a day (QID) | RESPIRATORY_TRACT | 11 refills | Status: CP | PRN
Start: 2018-01-16 — End: ?

## 2018-01-16 MED ORDER — TIZANIDINE HCL 4 MG PO TABS
4 mg | Freq: Three times a day (TID) | ORAL | 2 refills | Status: CP | PRN
Start: 2018-01-16 — End: ?

## 2018-01-21 ENCOUNTER — Ambulatory Visit: Attending: Family Medicine

## 2018-01-21 DIAGNOSIS — D649 Anemia, unspecified: Principal | ICD-10-CM

## 2018-04-01 DIAGNOSIS — M1711 Unilateral primary osteoarthritis, right knee: Principal | ICD-10-CM

## 2018-04-05 MED ORDER — TIZANIDINE HCL 4 MG PO TABS
4 mg | Freq: Three times a day (TID) | ORAL | 2 refills | Status: CP | PRN
Start: 2018-04-05 — End: ?

## 2018-09-30 ENCOUNTER — Encounter: Attending: Family | Primary: Family

## 2018-10-28 ENCOUNTER — Encounter: Attending: Family Medicine | Primary: Family

## 2018-11-09 DIAGNOSIS — I10 Essential (primary) hypertension: Principal | ICD-10-CM

## 2018-11-11 MED ORDER — AMLODIPINE BESYLATE 10 MG PO TABS
10 mg | Freq: Every day | ORAL | 0 refills | Status: CP
Start: 2018-11-11 — End: ?

## 2018-11-15 DIAGNOSIS — I10 Essential (primary) hypertension: Principal | ICD-10-CM

## 2018-11-22 DIAGNOSIS — I10 Essential (primary) hypertension: Principal | ICD-10-CM

## 2018-11-27 MED ORDER — BISOPROLOL-HYDROCHLOROTHIAZIDE 5-6.25 MG PO TABS
1 | ORAL_TABLET | Freq: Every day | ORAL | 0 refills | Status: CP
Start: 2018-11-27 — End: ?

## 2018-11-27 MED ORDER — AMLODIPINE BESYLATE 10 MG PO TABS
10 mg | Freq: Every day | ORAL | 0 refills | Status: CP
Start: 2018-11-27 — End: ?

## 2018-12-06 DIAGNOSIS — I10 Essential (primary) hypertension: Principal | ICD-10-CM

## 2018-12-08 MED ORDER — AMLODIPINE BESYLATE 10 MG PO TABS
10 mg | Freq: Every day | ORAL | 0 refills
Start: 2018-12-08 — End: ?

## 2019-02-17 ENCOUNTER — Encounter: Attending: Family Medicine | Primary: Family

## 2019-03-23 ENCOUNTER — Ambulatory Visit: Payer: MEDICAID | Attending: Family | Primary: Family

## 2019-03-23 DIAGNOSIS — J4520 Mild intermittent asthma, uncomplicated: Secondary | ICD-10-CM

## 2019-03-23 DIAGNOSIS — M1711 Unilateral primary osteoarthritis, right knee: Secondary | ICD-10-CM

## 2019-03-23 DIAGNOSIS — Z6839 Body mass index (BMI) 39.0-39.9, adult: Principal | ICD-10-CM

## 2019-03-23 DIAGNOSIS — J45909 Unspecified asthma, uncomplicated: Secondary | ICD-10-CM

## 2019-03-23 DIAGNOSIS — Z13220 Encounter for screening for lipoid disorders: Secondary | ICD-10-CM

## 2019-03-23 DIAGNOSIS — G709 Myoneural disorder, unspecified: Secondary | ICD-10-CM

## 2019-03-23 DIAGNOSIS — Z1329 Encounter for screening for other suspected endocrine disorder: Secondary | ICD-10-CM

## 2019-03-23 DIAGNOSIS — D649 Anemia, unspecified: Secondary | ICD-10-CM

## 2019-03-23 DIAGNOSIS — I10 Essential (primary) hypertension: Secondary | ICD-10-CM

## 2019-03-23 DIAGNOSIS — M1990 Unspecified osteoarthritis, unspecified site: Secondary | ICD-10-CM

## 2019-03-23 MED ORDER — MELOXICAM 15 MG PO TABS
1 refills | 60.00 days | Status: CP
Start: 2019-03-23 — End: ?

## 2019-03-23 MED ORDER — BISOPROLOL-HYDROCHLOROTHIAZIDE 5-6.25 MG PO TABS
1 | ORAL_TABLET | Freq: Every day | ORAL | 0 refills | Status: CP
Start: 2019-03-23 — End: ?

## 2019-03-23 MED ORDER — AMLODIPINE BESYLATE 10 MG PO TABS
10 mg | Freq: Every day | ORAL | 0 refills | Status: CP
Start: 2019-03-23 — End: ?

## 2019-03-23 MED ORDER — TIZANIDINE HCL 4 MG PO TABS
4 mg | Freq: Three times a day (TID) | ORAL | 2 refills | 30.00 days | Status: CP | PRN
Start: 2019-03-23 — End: ?

## 2019-04-19 DIAGNOSIS — I10 Essential (primary) hypertension: Principal | ICD-10-CM

## 2019-04-19 MED ORDER — BISOPROLOL-HYDROCHLOROTHIAZIDE 5-6.25 MG PO TABS
ORAL_TABLET | ORAL | 0 refills | Status: CP
Start: 2019-04-19 — End: ?

## 2019-04-19 MED ORDER — AMLODIPINE BESYLATE 10 MG PO TABS
10 mg | Freq: Every day | ORAL | 0 refills | Status: CP
Start: 2019-04-19 — End: ?

## 2019-05-17 DIAGNOSIS — I10 Essential (primary) hypertension: Principal | ICD-10-CM

## 2019-05-19 MED ORDER — BISOPROLOL-HYDROCHLOROTHIAZIDE 5-6.25 MG PO TABS
ORAL_TABLET | ORAL | 1 refills | Status: CP
Start: 2019-05-19 — End: ?

## 2019-05-19 MED ORDER — AMLODIPINE BESYLATE 10 MG PO TABS
10 mg | Freq: Every day | ORAL | 1 refills | Status: CP
Start: 2019-05-19 — End: ?

## 2019-05-26 ENCOUNTER — Ambulatory Visit: Payer: MEDICAID | Attending: Family | Primary: Family

## 2019-05-26 DIAGNOSIS — M1990 Unspecified osteoarthritis, unspecified site: Secondary | ICD-10-CM

## 2019-05-26 DIAGNOSIS — J4520 Mild intermittent asthma, uncomplicated: Secondary | ICD-10-CM

## 2019-05-26 DIAGNOSIS — I10 Essential (primary) hypertension: Principal | ICD-10-CM

## 2019-05-26 DIAGNOSIS — D649 Anemia, unspecified: Secondary | ICD-10-CM

## 2019-05-26 DIAGNOSIS — G709 Myoneural disorder, unspecified: Secondary | ICD-10-CM

## 2019-05-26 DIAGNOSIS — J45909 Unspecified asthma, uncomplicated: Secondary | ICD-10-CM

## 2019-05-26 DIAGNOSIS — D501 Sideropenic dysphagia: Secondary | ICD-10-CM

## 2019-05-26 DIAGNOSIS — Z6838 Body mass index (BMI) 38.0-38.9, adult: Principal | ICD-10-CM

## 2019-05-26 MED ORDER — PROAIR HFA 108 (90 BASE) MCG/ACT IN AERS
1 | Freq: Four times a day (QID) | RESPIRATORY_TRACT | 11 refills | 25.00000 days | Status: CP | PRN
Start: 2019-05-26 — End: ?

## 2019-05-26 MED ORDER — DOCUSATE SODIUM 100 MG PO CAPS
100 mg | Freq: Two times a day (BID) | ORAL | 6 refills | 30.00 days | Status: CP | PRN
Start: 2019-05-26 — End: ?

## 2019-06-15 DIAGNOSIS — M1711 Unilateral primary osteoarthritis, right knee: Principal | ICD-10-CM

## 2019-06-20 MED ORDER — TIZANIDINE HCL 4 MG PO TABS
4 mg | Freq: Three times a day (TID) | ORAL | 2 refills | 30.00 days | Status: CP | PRN
Start: 2019-06-20 — End: ?

## 2019-07-07 ENCOUNTER — Ambulatory Visit: Admit: 2019-07-07 | Discharge: 2019-07-07 | Attending: Family

## 2019-07-07 DIAGNOSIS — M1990 Unspecified osteoarthritis, unspecified site: Secondary | ICD-10-CM

## 2019-07-07 DIAGNOSIS — Z6838 Body mass index (BMI) 38.0-38.9, adult: Principal | ICD-10-CM

## 2019-07-07 DIAGNOSIS — M2550 Pain in unspecified joint: Secondary | ICD-10-CM

## 2019-07-07 DIAGNOSIS — I10 Essential (primary) hypertension: Principal | ICD-10-CM

## 2019-07-07 DIAGNOSIS — J45909 Unspecified asthma, uncomplicated: Secondary | ICD-10-CM

## 2019-07-07 DIAGNOSIS — D649 Anemia, unspecified: Secondary | ICD-10-CM

## 2019-07-07 DIAGNOSIS — D501 Sideropenic dysphagia: Secondary | ICD-10-CM

## 2019-07-07 DIAGNOSIS — J4520 Mild intermittent asthma, uncomplicated: Secondary | ICD-10-CM

## 2019-07-07 DIAGNOSIS — M1711 Unilateral primary osteoarthritis, right knee: Secondary | ICD-10-CM

## 2019-07-07 DIAGNOSIS — G709 Myoneural disorder, unspecified: Secondary | ICD-10-CM

## 2019-07-07 MED ORDER — PROAIR HFA 108 (90 BASE) MCG/ACT IN AERS
1 | Freq: Four times a day (QID) | RESPIRATORY_TRACT | 11 refills | 25.00000 days | Status: CP | PRN
Start: 2019-07-07 — End: ?

## 2019-07-07 MED ORDER — GABAPENTIN 100 MG PO CAPS
100 mg | Freq: Three times a day (TID) | ORAL | 0 refills | Status: CP
Start: 2019-07-07 — End: ?

## 2019-07-07 MED ORDER — PREDNISONE 10 MG (21) PO TBPK
0 refills | Status: CP
Start: 2019-07-07 — End: ?

## 2019-07-07 MED ORDER — DOCUSATE SODIUM 100 MG PO CAPS
100 mg | Freq: Two times a day (BID) | ORAL | 6 refills | 90.00 days | Status: CP | PRN
Start: 2019-07-07 — End: ?

## 2019-07-07 MED ORDER — TIZANIDINE HCL 4 MG PO TABS
4 mg | Freq: Three times a day (TID) | ORAL | 2 refills | Status: CP | PRN
Start: 2019-07-07 — End: ?

## 2019-07-14 ENCOUNTER — Ambulatory Visit: Admit: 2019-07-14 | Discharge: 2019-07-14 | Attending: Medical

## 2019-07-14 DIAGNOSIS — I10 Essential (primary) hypertension: Principal | ICD-10-CM

## 2019-07-14 DIAGNOSIS — Z6838 Body mass index (BMI) 38.0-38.9, adult: Principal | ICD-10-CM

## 2019-07-14 DIAGNOSIS — G709 Myoneural disorder, unspecified: Secondary | ICD-10-CM

## 2019-07-14 DIAGNOSIS — D649 Anemia, unspecified: Secondary | ICD-10-CM

## 2019-07-14 DIAGNOSIS — J45909 Unspecified asthma, uncomplicated: Secondary | ICD-10-CM

## 2019-07-14 DIAGNOSIS — M1711 Unilateral primary osteoarthritis, right knee: Secondary | ICD-10-CM

## 2019-07-14 DIAGNOSIS — M65312 Trigger thumb, left thumb: Secondary | ICD-10-CM

## 2019-07-14 DIAGNOSIS — M1990 Unspecified osteoarthritis, unspecified site: Secondary | ICD-10-CM

## 2019-07-21 ENCOUNTER — Ambulatory Visit: Payer: MEDICAID | Attending: Medical | Primary: Family

## 2019-07-21 DIAGNOSIS — Z6838 Body mass index (BMI) 38.0-38.9, adult: Principal | ICD-10-CM

## 2019-08-02 ENCOUNTER — Ambulatory Visit: Admit: 2019-08-02 | Discharge: 2019-08-02 | Attending: Medical

## 2019-08-04 ENCOUNTER — Ambulatory Visit: Payer: MEDICAID | Attending: Medical | Primary: Family

## 2019-08-04 DIAGNOSIS — J45909 Unspecified asthma, uncomplicated: Secondary | ICD-10-CM

## 2019-08-04 DIAGNOSIS — D649 Anemia, unspecified: Secondary | ICD-10-CM

## 2019-08-04 DIAGNOSIS — M1990 Unspecified osteoarthritis, unspecified site: Secondary | ICD-10-CM

## 2019-08-04 DIAGNOSIS — I10 Essential (primary) hypertension: Principal | ICD-10-CM

## 2019-08-04 DIAGNOSIS — Z6838 Body mass index (BMI) 38.0-38.9, adult: Principal | ICD-10-CM

## 2019-08-04 DIAGNOSIS — G709 Myoneural disorder, unspecified: Secondary | ICD-10-CM

## 2019-12-01 DIAGNOSIS — I10 Essential (primary) hypertension: Principal | ICD-10-CM

## 2019-12-01 MED ORDER — BISOPROLOL-HYDROCHLOROTHIAZIDE 5-6.25 MG PO TABS
1 | ORAL_TABLET | Freq: Every day | ORAL | 1 refills | Status: CP
Start: 2019-12-01 — End: ?

## 2019-12-01 MED ORDER — AMLODIPINE BESYLATE 10 MG PO TABS
10 mg | Freq: Every day | ORAL | 1 refills | Status: CP
Start: 2019-12-01 — End: ?

## 2019-12-15 ENCOUNTER — Ambulatory Visit: Payer: Medicaid Other | Attending: Medical | Primary: Family

## 2019-12-15 DIAGNOSIS — J45909 Unspecified asthma, uncomplicated: Secondary | ICD-10-CM

## 2019-12-15 DIAGNOSIS — M1711 Unilateral primary osteoarthritis, right knee: Secondary | ICD-10-CM

## 2019-12-15 DIAGNOSIS — I10 Essential (primary) hypertension: Principal | ICD-10-CM

## 2019-12-15 DIAGNOSIS — M1990 Unspecified osteoarthritis, unspecified site: Secondary | ICD-10-CM

## 2019-12-15 DIAGNOSIS — G709 Myoneural disorder, unspecified: Secondary | ICD-10-CM

## 2019-12-15 DIAGNOSIS — M1712 Unilateral primary osteoarthritis, left knee: Secondary | ICD-10-CM

## 2019-12-15 DIAGNOSIS — D649 Anemia, unspecified: Secondary | ICD-10-CM

## 2019-12-15 DIAGNOSIS — M65312 Trigger thumb, left thumb: Secondary | ICD-10-CM

## 2019-12-15 DIAGNOSIS — M79645 Pain in left finger(s): Secondary | ICD-10-CM

## 2019-12-15 DIAGNOSIS — Z6838 Body mass index (BMI) 38.0-38.9, adult: Principal | ICD-10-CM

## 2019-12-15 MED ORDER — PREDNISONE 10 MG (21) PO TBPK
0 refills | 7.00 days | Status: CP
Start: 2019-12-15 — End: ?

## 2019-12-29 ENCOUNTER — Ambulatory Visit: Payer: Medicaid Other | Attending: Physician Assistant | Primary: Family

## 2019-12-29 DIAGNOSIS — M25473 Effusion, unspecified ankle: Secondary | ICD-10-CM

## 2019-12-29 DIAGNOSIS — M1990 Unspecified osteoarthritis, unspecified site: Secondary | ICD-10-CM

## 2019-12-29 DIAGNOSIS — G709 Myoneural disorder, unspecified: Secondary | ICD-10-CM

## 2019-12-29 DIAGNOSIS — I10 Essential (primary) hypertension: Secondary | ICD-10-CM

## 2019-12-29 DIAGNOSIS — J4520 Mild intermittent asthma, uncomplicated: Principal | ICD-10-CM

## 2019-12-29 DIAGNOSIS — D649 Anemia, unspecified: Secondary | ICD-10-CM

## 2019-12-29 DIAGNOSIS — Z6839 Body mass index (BMI) 39.0-39.9, adult: Secondary | ICD-10-CM

## 2019-12-29 DIAGNOSIS — J45909 Unspecified asthma, uncomplicated: Secondary | ICD-10-CM

## 2019-12-29 DIAGNOSIS — Z1231 Encounter for screening mammogram for malignant neoplasm of breast: Secondary | ICD-10-CM

## 2020-01-12 ENCOUNTER — Encounter: Primary: Family

## 2020-01-31 DIAGNOSIS — M549 Dorsalgia, unspecified: Secondary | ICD-10-CM | POA: Diagnosis not present

## 2020-01-31 DIAGNOSIS — Z03818 Encounter for observation for suspected exposure to other biological agents ruled out: Secondary | ICD-10-CM | POA: Diagnosis not present

## 2020-01-31 DIAGNOSIS — R52 Pain, unspecified: Secondary | ICD-10-CM | POA: Diagnosis not present

## 2020-01-31 DIAGNOSIS — R519 Headache, unspecified: Secondary | ICD-10-CM | POA: Diagnosis not present

## 2020-01-31 DIAGNOSIS — R5383 Other fatigue: Secondary | ICD-10-CM | POA: Diagnosis not present

## 2020-01-31 DIAGNOSIS — M545 Low back pain, unspecified: Secondary | ICD-10-CM | POA: Diagnosis not present

## 2020-02-14 ENCOUNTER — Encounter: Payer: Medicaid Other | Primary: Family

## 2020-02-16 ENCOUNTER — Encounter: Payer: Medicaid Other | Attending: Family Medicine | Primary: Family

## 2020-02-16 ENCOUNTER — Encounter: Payer: Medicaid Other | Attending: Physician Assistant | Primary: Family

## 2020-02-16 DIAGNOSIS — I10 Essential (primary) hypertension: Principal | ICD-10-CM

## 2020-02-16 DIAGNOSIS — J45909 Unspecified asthma, uncomplicated: Secondary | ICD-10-CM

## 2020-02-16 DIAGNOSIS — R768 Other specified abnormal immunological findings in serum: Principal | ICD-10-CM

## 2020-02-16 DIAGNOSIS — M1990 Unspecified osteoarthritis, unspecified site: Secondary | ICD-10-CM

## 2020-02-16 DIAGNOSIS — D649 Anemia, unspecified: Secondary | ICD-10-CM

## 2020-02-16 DIAGNOSIS — M2550 Pain in unspecified joint: Secondary | ICD-10-CM

## 2020-02-16 DIAGNOSIS — G709 Myoneural disorder, unspecified: Secondary | ICD-10-CM

## 2020-02-17 ENCOUNTER — Telehealth: Payer: Medicaid Other | Primary: Family

## 2020-03-15 ENCOUNTER — Encounter: Payer: Medicaid Other | Primary: Family

## 2020-03-26 ENCOUNTER — Encounter: Payer: Medicaid Other | Attending: Physician Assistant | Primary: Family

## 2020-04-05 ENCOUNTER — Ambulatory Visit: Payer: Medicaid Other | Attending: Physician Assistant | Primary: Family

## 2020-04-05 DIAGNOSIS — D501 Sideropenic dysphagia: Secondary | ICD-10-CM

## 2020-04-05 DIAGNOSIS — J4520 Mild intermittent asthma, uncomplicated: Secondary | ICD-10-CM

## 2020-04-05 DIAGNOSIS — M1990 Unspecified osteoarthritis, unspecified site: Secondary | ICD-10-CM

## 2020-04-05 DIAGNOSIS — I10 Essential (primary) hypertension: Secondary | ICD-10-CM

## 2020-04-05 DIAGNOSIS — G709 Myoneural disorder, unspecified: Secondary | ICD-10-CM

## 2020-04-05 DIAGNOSIS — R768 Other specified abnormal immunological findings in serum: Secondary | ICD-10-CM

## 2020-04-05 DIAGNOSIS — D649 Anemia, unspecified: Secondary | ICD-10-CM

## 2020-04-05 DIAGNOSIS — J45909 Unspecified asthma, uncomplicated: Secondary | ICD-10-CM

## 2020-04-05 DIAGNOSIS — D508 Other iron deficiency anemias: Secondary | ICD-10-CM

## 2020-04-05 DIAGNOSIS — Z6839 Body mass index (BMI) 39.0-39.9, adult: Principal | ICD-10-CM

## 2020-04-05 DIAGNOSIS — M5030 Other cervical disc degeneration, unspecified cervical region: Secondary | ICD-10-CM

## 2020-04-05 DIAGNOSIS — Z1231 Encounter for screening mammogram for malignant neoplasm of breast: Secondary | ICD-10-CM

## 2020-04-05 MED ORDER — FERROUS SULFATE 325 (65 FE) MG PO TABS
325 mg | Freq: Every day | ORAL | 1 refills | Status: CP
Start: 2020-04-05 — End: ?

## 2020-04-05 MED ORDER — BISOPROLOL-HYDROCHLOROTHIAZIDE 10-6.25 MG PO TABS
1 | ORAL_TABLET | Freq: Every day | ORAL | 0 refills | Status: CP
Start: 2020-04-05 — End: ?

## 2020-04-30 ENCOUNTER — Other Ambulatory Visit: Payer: Self-pay | Admitting: Obstetrics and Gynecology

## 2020-04-30 DIAGNOSIS — N951 Menopausal and female climacteric states: Secondary | ICD-10-CM | POA: Diagnosis not present

## 2020-04-30 DIAGNOSIS — Z1231 Encounter for screening mammogram for malignant neoplasm of breast: Secondary | ICD-10-CM

## 2020-04-30 DIAGNOSIS — N898 Other specified noninflammatory disorders of vagina: Secondary | ICD-10-CM | POA: Diagnosis not present

## 2020-05-07 ENCOUNTER — Other Ambulatory Visit: Payer: Self-pay

## 2020-05-07 ENCOUNTER — Ambulatory Visit
Admission: RE | Admit: 2020-05-07 | Discharge: 2020-05-07 | Disposition: A | Discharge status: Home / Self Care | Payer: BC Managed Care – PPO | Source: Ambulatory Visit | Attending: Obstetrics and Gynecology | Admitting: Obstetrics and Gynecology

## 2020-05-07 DIAGNOSIS — Z1231 Encounter for screening mammogram for malignant neoplasm of breast: Secondary | ICD-10-CM

## 2020-05-31 ENCOUNTER — Encounter: Payer: Medicaid Other | Attending: Family | Primary: Family

## 2020-06-05 DIAGNOSIS — J4520 Mild intermittent asthma, uncomplicated: Principal | ICD-10-CM

## 2020-06-07 MED ORDER — ALBUTEROL SULFATE HFA 108 (90 BASE) MCG/ACT IN AERS
RESPIRATORY_TRACT | 3 refills | 25.00000 days | Status: CP
Start: 2020-06-07 — End: ?

## 2020-06-14 ENCOUNTER — Other Ambulatory Visit: Payer: Self-pay

## 2020-06-14 ENCOUNTER — Encounter (HOSPITAL_BASED_OUTPATIENT_CLINIC_OR_DEPARTMENT_OTHER): Payer: Self-pay | Admitting: Emergency Medicine

## 2020-06-14 ENCOUNTER — Encounter (HOSPITAL_BASED_OUTPATIENT_CLINIC_OR_DEPARTMENT_OTHER): Payer: Self-pay | Admitting: *Deleted

## 2020-06-14 ENCOUNTER — Emergency Department (HOSPITAL_BASED_OUTPATIENT_CLINIC_OR_DEPARTMENT_OTHER)
Admission: EM | Admit: 2020-06-14 | Discharge: 2020-06-14 | Disposition: A | Discharge status: Home / Self Care | Payer: BC Managed Care – PPO | Attending: Emergency Medicine | Admitting: Emergency Medicine

## 2020-06-14 DIAGNOSIS — F17210 Nicotine dependence, cigarettes, uncomplicated: Secondary | ICD-10-CM | POA: Insufficient documentation

## 2020-06-14 DIAGNOSIS — R739 Hyperglycemia, unspecified: Secondary | ICD-10-CM | POA: Diagnosis not present

## 2020-06-14 DIAGNOSIS — R222 Localized swelling, mass and lump, trunk: Secondary | ICD-10-CM | POA: Diagnosis not present

## 2020-06-14 DIAGNOSIS — K219 Gastro-esophageal reflux disease without esophagitis: Secondary | ICD-10-CM | POA: Diagnosis not present

## 2020-06-14 DIAGNOSIS — K439 Ventral hernia without obstruction or gangrene: Secondary | ICD-10-CM | POA: Diagnosis not present

## 2020-06-14 DIAGNOSIS — Z6827 Body mass index (BMI) 27.0-27.9, adult: Secondary | ICD-10-CM | POA: Diagnosis not present

## 2020-06-14 DIAGNOSIS — E1165 Type 2 diabetes mellitus with hyperglycemia: Secondary | ICD-10-CM | POA: Diagnosis not present

## 2020-06-14 LAB — URINALYSIS, ROUTINE W REFLEX MICROSCOPIC
Bilirubin Urine: NEGATIVE
Glucose, UA: >=500 mg/dL — AB
Hgb urine dipstick: NEGATIVE
Ketones, ur: NEGATIVE mg/dL
Leukocytes,Ua: NEGATIVE
Nitrite: NEGATIVE
Protein, ur: NEGATIVE mg/dL
Specific Gravity, Urine: 1.01 (ref 1.005–1.030)
pH: 6 (ref 5.0–8.0)

## 2020-06-14 LAB — CBC
HCT: 41.5 % (ref 36.0–46.0)
Hemoglobin: 14.8 g/dL (ref 12.0–15.0)
MCH: 34.1 pg — ABNORMAL HIGH (ref 26.0–34.0)
MCHC: 35.7 g/dL (ref 30.0–36.0)
MCV: 95.6 fL (ref 80.0–100.0)
Platelets: 270 10*3/uL (ref 150–400)
RBC: 4.34 MIL/uL (ref 3.87–5.11)
RDW: 11.9 % (ref 11.5–15.5)
WBC: 9.1 10*3/uL (ref 4.0–10.5)
nRBC: 0 % (ref 0.0–0.2)

## 2020-06-14 LAB — BASIC METABOLIC PANEL
Anion gap: 9 (ref 5–15)
BUN: 9 mg/dL (ref 6–20)
CO2: 25 mmol/L (ref 22–32)
Calcium: 9.2 mg/dL (ref 8.9–10.3)
Chloride: 97 mmol/L — ABNORMAL LOW (ref 98–111)
Creatinine, Ser: 0.73 mg/dL (ref 0.44–1.00)
GFR, Estimated: >60 mL/min (ref >60)
Glucose, Bld: 311 mg/dL — ABNORMAL HIGH (ref 70–99)
Potassium: 3.6 mmol/L (ref 3.5–5.1)
Sodium: 131 mmol/L — ABNORMAL LOW (ref 135–145)

## 2020-06-14 LAB — CBG MONITORING, ED: Glucose-Capillary: 307 mg/dL — ABNORMAL HIGH (ref 70–99)

## 2020-06-14 LAB — URINALYSIS, MICROSCOPIC (REFLEX): RBC / HPF: NONE SEEN RBC/hpf (ref 0–5)

## 2020-06-14 LAB — HEMOGLOBIN A1C
Hgb A1c MFr Bld: 10.1 % — ABNORMAL HIGH (ref 4.8–5.6)
Mean Plasma Glucose: 243.17 mg/dL

## 2020-06-14 MED ORDER — SODIUM CHLORIDE 0.9 % IV BOLUS
1000.0000 mL | Freq: Once | INTRAVENOUS | Status: AC
  Administered 2020-06-14: 1000 mL via INTRAVENOUS

## 2020-06-14 MED ORDER — METFORMIN HCL 500 MG PO TABS: 500.0000 mg | ORAL_TABLET | Freq: Two times a day (BID) | ORAL | 0 refills | Status: AC

## 2020-06-14 MED ORDER — METFORMIN HCL 500 MG PO TABS
500.0000 mg | ORAL_TABLET | Freq: Once | ORAL | Status: AC
  Administered 2020-06-14: 500 mg via ORAL
  Filled 2020-06-14: qty 1

## 2020-06-14 NOTE — ED Provider Notes (Signed)
[facility] EMERGENCY DEPARTMENT Provider Note   CSN: [id number] Arrival date & time: 06/14/20  1747     History Chief Complaint  Patient presents with  . Hyperglycemia    Alicia Salas is a 55 y.o. female.  Patient with history of abdominal hysterectomy presents to the emergency department today for evaluation of hyperglycemia.  Patient scheduled an appointment with her doctor because of upper abdominal swelling which was new.  She does have history of GERD.  Labs ordered by PCP.  Patient was found to have, per her report, blood sugar greater than 500.  She was told to come to the emergency department urgently.  Patient denies associated nausea, vomiting, diarrhea or constipation.  No fevers, chest pain or shortness of breath.  No URI sx. she does report having increased thirst and urination but attributes some of this to chronic sinus symptoms.  The swelling over the upper abdomen is mildly tender.  No previous history of high blood sugar.        History reviewed. No pertinent past medical history.  There are no problems to display for this patient.   Past Surgical History:  Procedure Laterality Date  . ABDOMINAL HYSTERECTOMY    . BREAST SURGERY    . TONSILLECTOMY       OB History   No obstetric history on file.     No family history on file.  Social History   Tobacco Use  . Smoking status: Current Every Day Smoker    Packs/day: 1.00    Types: Cigarettes  . Smokeless tobacco: Never Used  Vaping Use  . Vaping Use: Never used  Substance Use Topics  . Alcohol use: Not Currently    Comment: soc    Home Medications Prior to Admission medications   Not on File    Allergies    Patient has no known allergies.  Review of Systems   Review of Systems  Constitutional: Negative for fever.  HENT: Negative for rhinorrhea and sore throat.   Eyes: Negative for redness.  Respiratory: Negative for cough.   Cardiovascular: Negative for chest pain.   Gastrointestinal: Positive for abdominal distention and abdominal pain. Negative for diarrhea, nausea and vomiting.  Endocrine: Positive for polydipsia and polyuria.  Genitourinary: Negative for dysuria, hematuria and urgency.  Musculoskeletal: Negative for myalgias.  Skin: Negative for rash.  Neurological: Negative for headaches.    Physical Exam Updated Vital Signs BP (!) 154/111   Pulse (!) 104   Resp 20   Ht 5\' 6"  (1.676 m)   Wt 75.3 kg   SpO2 100%   BMI 26.79 kg/m   Physical Exam Vitals and nursing note reviewed.  Constitutional:      General: She is not in acute distress.    Appearance: She is well-developed.  HENT:     Head: Normocephalic and atraumatic.     Right Ear: External ear normal.     Left Ear: External ear normal.     Nose: Nose normal.  Eyes:     Conjunctiva/sclera: Conjunctivae normal.  Cardiovascular:     Rate and Rhythm: Normal rate and regular rhythm.     Heart sounds: No murmur heard.   Pulmonary:     Effort: No respiratory distress.     Breath sounds: No wheezing, rhonchi or rales.  Abdominal:     Palpations: Abdomen is soft.     Tenderness: There is abdominal tenderness. There is no guarding or rebound.     Hernia: A hernia  is present.     Comments: Swelling, mild tenderness of upper abdomen. No overlying erythema or severe pain with palpation.  I can feel a several centimeter defect in the upper abdominal wall.  Hernia is readily reducible.  Suspect that this is most likely omental fat.   Musculoskeletal:     Cervical back: Normal range of motion and neck supple.     Right lower leg: No edema.     Left lower leg: No edema.  Skin:    General: Skin is warm and dry.     Findings: No rash.  Neurological:     General: No focal deficit present.     Mental Status: She is alert. Mental status is at baseline.     Motor: No weakness.  Psychiatric:        Mood and Affect: Mood normal.     ED Results / Procedures / Treatments   Labs (all  labs ordered are listed, but only abnormal results are displayed) Labs Reviewed  CBC - Abnormal; Notable for the following components:      Result Value   MCH 34.1 (*)    All other components within normal limits  BASIC METABOLIC PANEL - Abnormal; Notable for the following components:   Sodium 131 (*)    Chloride 97 (*)    Glucose, Bld 311 (*)    All other components within normal limits  URINALYSIS, ROUTINE W REFLEX MICROSCOPIC - Abnormal; Notable for the following components:   Glucose, UA >=500 (*)    All other components within normal limits  URINALYSIS, MICROSCOPIC (REFLEX) - Abnormal; Notable for the following components:   Bacteria, UA RARE (*)    All other components within normal limits  CBG MONITORING, ED - Abnormal; Notable for the following components:   Glucose-Capillary 307 (*)    All other components within normal limits  HEMOGLOBIN A1C  CBG MONITORING, ED    EKG None  Radiology No results found.  Procedures Procedures   Medications Ordered in ED Medications  sodium chloride 0.9 % bolus 1,000 mL (1,000 mLs Intravenous New Bag/Given 06/14/20 1821)  metFORMIN (GLUCOPHAGE) tablet 500 mg (500 mg Oral Given 06/14/20 1922)    ED Course  I have reviewed the triage vital signs and the nursing notes.  Pertinent labs & imaging results that were available during my care of the patient were reviewed by me and considered in my medical decision making (see chart for details).  Patient seen and examined.  Blood sugar was in the 300s here.  I was unable to see lab results from earlier today.  Will check CBC, BMP, UA.  Patient does not exhibit signs of DKA.  Symptoms are not concerning for strangulated hernia at this time.  Will give fluid bolus.  Vital signs reviewed and are as follows: BP (!) 154/111   Pulse (!) 104   Resp 20   Ht 5\' 6"  (1.676 m)   Wt 75.3 kg   SpO2 100%   BMI 26.79 kg/m   7:24 PM fluid bolus complete.  I reexamined the patient's abdomen.  Exam is  consistent with ventral hernia that is readily reducible.  She will be given a dose of metformin and given a prescription for home.  We will send hemoglobin A1c prior to discharge.  Patient urged to return with worsening symptoms or other concerns. Patient verbalized understanding and agrees with plan.     MDM Rules/Calculators/A&P  Hyperglycemia: In the low 300s here.  No signs of DKA.  Patient treated with IV fluids.  Started on metformin.  This is likely an incidental finding.  I do not suspect that the patient's upper abdominal symptoms are related to hernia today.  Upper abdominal swelling: This appears to be a ventral hernia.  It is readily reducible.  No signs of obstruction, strangulation or incarceration.  Patient's PCP is ordered imaging to further evaluate this.  We will continue with this plan.  Patient counseled on signs and symptoms of worsening, incarceration which should cause her to return.   Final Clinical Impression(s) / ED Diagnoses Final diagnoses:  Hyperglycemia  Ventral hernia without obstruction or gangrene    Rx / DC Orders ED Discharge Orders    None       Renne Crigler, PA-C 06/14/20 1930    Little, Ambrose Finland, MD 06/17/20 [phone]

## 2020-06-14 NOTE — ED Notes (Signed)
Had an appt with her primary MD today for c/o and evaluation of possible hernia. States she was called by the MD office and informed her CBG/ blood glucose levels were greater than 500. Was instructed to seek medical care at the nearest ED. Denies having frequent urination and need for PO fluids, stated she has issues with her sinuses and drinks a lot of water. Has good skin turgor, urine appears clear upon visual examination, has strong pulses, good capillary refill, skin is warm and dry. Has denied any recent illnesses, denies any nausea, vomiting or diarrhea. Has no previous hx of elevated blood glucose levels. ED PA at bedside, orders rec and implemented

## 2020-06-14 NOTE — Discharge Instructions (Signed)
Please read and follow all provided instructions.  Your diagnoses today include:  1. Hyperglycemia   2. Ventral hernia without obstruction or gangrene     Tests performed today include: Blood cell counts (white, red, and platelets) Electrolytes - high blood sugar around 300 Kidney function test Urine test to check for infection  Vital signs. See below for your results today.   Medications prescribed:   Metformin -first-line medication for high blood sugar  Take any prescribed medications only as directed.  Home care instructions:  Follow any educational materials contained in this packet.  BE VERY CAREFUL not to take multiple medicines containing Tylenol (also called acetaminophen). Doing so can lead to an overdose which can damage your liver and cause liver failure and possibly death.   Follow-up instructions: Please follow-up with your primary care provider in the next 7 days for further evaluation of your symptoms.   Return instructions:   Please return to the Emergency Department if you experience worsening symptoms.   Return to the emergency department if you develop a hard firm area in the upper abdomen with associated vomiting or severe pain.  Please return if you have any other emergent concerns.  Additional Information:  Your vital signs today were: BP (!) 162/94 (BP Location: Left Arm)   Pulse 92   Temp 98.3 F (36.8 C) (Oral)   Resp 20   Ht 5\' 6"  (1.676 m)   Wt 75.3 kg   SpO2 99%   BMI 26.79 kg/m  If your blood pressure (BP) was elevated above 135/85 this visit, please have this repeated by your doctor within one month. --------------

## 2020-06-14 NOTE — ED Triage Notes (Signed)
Labs drawn today by PMD glucose 500

## 2020-06-18 ENCOUNTER — Other Ambulatory Visit: Payer: Self-pay | Admitting: Family Medicine

## 2020-06-18 DIAGNOSIS — R222 Localized swelling, mass and lump, trunk: Secondary | ICD-10-CM

## 2020-06-19 DIAGNOSIS — E119 Type 2 diabetes mellitus without complications: Secondary | ICD-10-CM | POA: Diagnosis not present

## 2020-06-19 DIAGNOSIS — F17210 Nicotine dependence, cigarettes, uncomplicated: Secondary | ICD-10-CM | POA: Diagnosis not present

## 2020-06-19 DIAGNOSIS — K219 Gastro-esophageal reflux disease without esophagitis: Secondary | ICD-10-CM | POA: Diagnosis not present

## 2020-06-21 ENCOUNTER — Encounter: Payer: Medicaid Other | Primary: Family

## 2020-06-21 ENCOUNTER — Ambulatory Visit
Admission: RE | Admit: 2020-06-21 | Discharge: 2020-06-21 | Disposition: A | Discharge status: Home / Self Care | Payer: BC Managed Care – PPO | Source: Ambulatory Visit | Attending: Family Medicine | Admitting: Family Medicine

## 2020-06-21 DIAGNOSIS — R222 Localized swelling, mass and lump, trunk: Secondary | ICD-10-CM

## 2020-06-27 ENCOUNTER — Other Ambulatory Visit: Payer: Self-pay

## 2020-06-27 ENCOUNTER — Encounter: Payer: Self-pay | Admitting: Dietician

## 2020-06-27 ENCOUNTER — Encounter: Payer: BC Managed Care – PPO | Attending: Family Medicine | Admitting: Dietician

## 2020-06-27 DIAGNOSIS — E119 Type 2 diabetes mellitus without complications: Secondary | ICD-10-CM | POA: Diagnosis present

## 2020-06-27 NOTE — Progress Notes (Signed)
Patient was seen on 06/27/2020 for the first of a series of three diabetes self-management courses at the [facility].  Patient Education Plan per assessed needs and concerns is to attend three course education program for Diabetes Self Management Education.  The following learning objectives were met by the patient during this class:  Describe diabetes, types of diabetes and pathophysiology  State some common risk factors for diabetes  Defines the role of glucose and insulin  Describe the relationship between diabetes and cardiovascular and other risks  State the members of the Healthcare Team  States the rationale for glucose monitoring and when to test  State their individual Target Range  State the importance of logging glucose readings and how to interpret the readings  Identifies A1C target  Explain the correlation between A1c and eAG values  State symptoms and treatment of high blood glucose and low blood glucose  Explain proper technique for glucose testing and identify proper sharps disposal  Handouts given during class include:  How to Thrive:  A Guide for Your Journey with Diabetes by the ADA  Meal Plan Card and carbohydrate content list  Dietary intake form  Low Sodium Flavoring Tips  Types of Fats  Dining Out  Label reading  Snack list  Planning a balanced meal  The diabetes portion plate  Diabetes Resources  A1c to eAG Conversion Chart  Blood Glucose Log  Diabetes Recommended Care Schedule  Support Group  Diabetes Success Plan  Core Class Satisfaction Survey   Follow-Up Plan:  Attend core 2   

## 2020-06-28 ENCOUNTER — Ambulatory Visit: Payer: Medicaid Other | Attending: Family | Primary: Family

## 2020-06-28 DIAGNOSIS — G709 Myoneural disorder, unspecified: Secondary | ICD-10-CM

## 2020-06-28 DIAGNOSIS — I10 Essential (primary) hypertension: Principal | ICD-10-CM

## 2020-06-28 DIAGNOSIS — J45909 Unspecified asthma, uncomplicated: Secondary | ICD-10-CM

## 2020-06-28 DIAGNOSIS — D649 Anemia, unspecified: Secondary | ICD-10-CM

## 2020-06-28 DIAGNOSIS — M170 Bilateral primary osteoarthritis of knee: Principal | ICD-10-CM

## 2020-06-28 DIAGNOSIS — M1990 Unspecified osteoarthritis, unspecified site: Secondary | ICD-10-CM

## 2020-06-28 DIAGNOSIS — Z6836 Body mass index (BMI) 36.0-36.9, adult: Secondary | ICD-10-CM

## 2020-07-04 ENCOUNTER — Other Ambulatory Visit: Payer: Self-pay

## 2020-07-04 ENCOUNTER — Encounter: Payer: Self-pay | Admitting: Dietician

## 2020-07-04 ENCOUNTER — Encounter: Payer: BC Managed Care – PPO | Admitting: Dietician

## 2020-07-04 DIAGNOSIS — E119 Type 2 diabetes mellitus without complications: Secondary | ICD-10-CM

## 2020-07-04 NOTE — Progress Notes (Signed)
Patient was seen on 07/04/2020 for the second of a series of three diabetes self-management courses at the [facility]. The following learning objectives were met by the patient during this class:   Describe the role of different macronutrients on glucose  Explain how carbohydrates affect blood glucose  State what foods contain the most carbohydrates  Demonstrate carbohydrate counting  Demonstrate how to read Nutrition Facts food label  Describe effects of various fats on heart health  Describe the importance of good nutrition for health and healthy eating strategies  Describe techniques for managing your shopping, cooking and meal planning  List strategies to follow meal plan when dining out  Describe the effects of alcohol on glucose and how to use it safely  Goals:  Follow Diabetes Meal Plan as instructed  Aim to spread carbs evenly throughout the day  Aim for 3 meals per day and snacks as needed Include lean protein foods to meals/snacks  Monitor glucose levels as instructed by your doctor   Follow-Up Plan:  Attend Core 3  Work towards following your personal food plan.   

## 2020-07-11 ENCOUNTER — Encounter: Payer: BC Managed Care – PPO | Attending: Family Medicine | Admitting: Dietician

## 2020-07-11 ENCOUNTER — Other Ambulatory Visit: Payer: Self-pay

## 2020-07-11 ENCOUNTER — Encounter: Payer: Self-pay | Admitting: Dietician

## 2020-07-11 DIAGNOSIS — E119 Type 2 diabetes mellitus without complications: Secondary | ICD-10-CM | POA: Diagnosis not present

## 2020-07-11 NOTE — Progress Notes (Signed)
Patient was seen on 07/11/2020 for the third of a series of three diabetes self-management courses at the [facility].   Alicia Salas the amount of activity recommended for healthy living . Describe activities suitable for individual needs . Identify ways to regularly incorporate activity into daily life . Identify barriers to activity and ways to over come these barriers  Identify diabetes medications being personally used and their primary action for lowering glucose and possible side effects . Describe role of stress on blood glucose and develop strategies to address psychosocial issues . Identify diabetes complications and ways to prevent them  Explain how to manage diabetes during illness . Evaluate success in meeting personal goal . Establish 2-3 goals that they will plan to diligently work on  Goals:   I will count my carb choices at most meals and snacks  I will be active 15 minutes or more 5 times a week  I will take my diabetes medications as scheduled  I will eat less unhealthy fats by eating less cheese, sweets, fatty meats  I will test my glucose at least 2 times a day, 7 days a week  I will look at patterns in my record book at least 30 days a month  To help manage stress I will  walk at least 5 times a week  Your patient has identified these potential barriers to change:  Stress   Your patient has identified their diabetes self-care support plan as  Family Support    Plan:  Attend Support Group as desired

## 2020-07-24 ENCOUNTER — Ambulatory Visit: Payer: Self-pay | Admitting: Surgery

## 2020-07-24 NOTE — Progress Notes (Signed)
DUE TO COVID-19 ONLY ONE VISITOR IS ALLOWED TO COME WITH YOU AND STAY IN THE WAITING ROOM ONLY DURING PRE OP AND PROCEDURE DAY OF SURGERY. THE 1 VISITOR  MAY VISIT WITH YOU AFTER SURGERY IN YOUR PRIVATE ROOM DURING VISITING HOURS ONLY!  YOU NEED TO HAVE A COVID 19 TEST ON__6/16/2022_____ @_______ , THIS TEST MUST BE DONE BEFORE SURGERY,  COVID TESTING SITE [address] [place] , IT IS ON THE RIGHT GOING OUT [address] APPROXIMATELY  2 MINUTES PAST [employer] ON THE RIGHT. ONCE YOUR COVID TEST IS COMPLETED,  PLEASE BEGIN THE QUARANTINE INSTRUCTIONS AS OUTLINED IN YOUR HANDOUT.                Korina Tretter  07/24/2020   Your procedure is scheduled on:  07/29/2020   Report to [facility] Main  Entrance   Report to admitting at     1230pm     Call this number if you have problems the morning of surgery [phone]    REMEMBER: NO  SOLID FOOD CANDY OR GUM AFTER MIDNIGHT. CLEAR LIQUIDS UNTIL   1130am       . NOTHING BY MOUTH EXCEPT CLEAR LIQUIDS UNTIL   1130am  . PLEASE FINISH ENSURE DRINK PER SURGEON ORDER  WHICH NEEDS TO BE COMPLETED AT    1130am   .      CLEAR LIQUID DIET   Foods Allowed                                                                    Coffee and tea, regular and decaf                            Fruit ices (not with fruit pulp)                                      Iced Popsicles                                    Carbonated beverages, regular and diet                                    Cranberry, grape and apple juices Sports drinks like Gatorade Lightly seasoned clear broth or consume(fat free) Sugar, honey syrup ___________________________________________________________________      BRUSH YOUR TEETH MORNING OF SURGERY AND RINSE YOUR MOUTH OUT, NO CHEWING GUM CANDY OR MINTS.     Take these medicines the morning of surgery with A SIP OF WATER: none   DO NOT TAKE ANY DIABETIC MEDICATIONS DAY OF YOUR SURGERY                                You may not have any metal on your body including hair pins and              piercings  Do not wear  jewelry, make-up, lotions, powders or perfumes, deodorant             Do not wear nail polish on your fingernails.  Do not shave  48 hours prior to surgery.              Men may shave face and neck.   Do not bring valuables to the hospital. [place].  Contacts, dentures or bridgework may not be worn into surgery.  Leave suitcase in the car. After surgery it may be brought to your room.     Patients discharged the day of surgery will not be allowed to drive home. IF YOU ARE HAVING SURGERY AND GOING HOME THE SAME DAY, YOU MUST HAVE AN ADULT TO DRIVE YOU HOME AND BE WITH YOU FOR 24 HOURS. YOU MAY GO HOME BY TAXI OR UBER OR ORTHERWISE, BUT AN ADULT MUST ACCOMPANY YOU HOME AND STAY WITH YOU FOR 24 HOURS.  Name and phone number of your driver:  Special Instructions: N/A              Please read over the following fact sheets you were given: _____________________________________________________________________  [facility] - Preparing for Surgery Before surgery, you can play an important role.  Because skin is not sterile, your skin needs to be as free of germs as possible.  You can reduce the number of germs on your skin by washing with CHG (chlorahexidine gluconate) soap before surgery.  CHG is an antiseptic cleaner which kills germs and bonds with the skin to continue killing germs even after washing. Please DO NOT use if you have an allergy to CHG or antibacterial soaps.  If your skin becomes reddened/irritated stop using the CHG and inform your nurse when you arrive at Short Stay. Do not shave (including legs and underarms) for at least 48 hours prior to the first CHG shower.  You may shave your face/neck. Please follow these instructions carefully:  1.  Shower with CHG Soap the night before surgery and the  morning of  Surgery.  2.  If you choose to wash your hair, wash your hair first as usual with your  normal  shampoo.  3.  After you shampoo, rinse your hair and body thoroughly to remove the  shampoo.                           4.  Use CHG as you would any other liquid soap.  You can apply chg directly  to the skin and wash                       Gently with a scrungie or clean washcloth.  5.  Apply the CHG Soap to your body ONLY FROM THE NECK DOWN.   Do not use on face/ open                           Wound or open sores. Avoid contact with eyes, ears mouth and genitals (private parts).                       Wash face,  Genitals (private parts) with your normal soap.             6.  Wash thoroughly, paying special  attention to the area where your surgery  will be performed.  7.  Thoroughly rinse your body with warm water from the neck down.  8.  DO NOT shower/wash with your normal soap after using and rinsing off  the CHG Soap.                9.  Pat yourself dry with a clean towel.            10.  Wear clean pajamas.            11.  Place clean sheets on your bed the night of your first shower and do not  sleep with pets. Day of Surgery : Do not apply any lotions/deodorants the morning of surgery.  Please wear clean clothes to the hospital/surgery center.  FAILURE TO FOLLOW THESE INSTRUCTIONS MAY RESULT IN THE CANCELLATION OF YOUR SURGERY PATIENT SIGNATURE_________________________________  NURSE SIGNATURE__________________________________  ________________________________________________________________________

## 2020-07-25 ENCOUNTER — Other Ambulatory Visit: Payer: Self-pay

## 2020-07-25 ENCOUNTER — Encounter (HOSPITAL_COMMUNITY)
Admission: RE | Admit: 2020-07-25 | Discharge: 2020-07-25 | Disposition: A | Discharge status: Home / Self Care | Payer: BC Managed Care – PPO | Source: Ambulatory Visit | Attending: Surgery | Admitting: Surgery

## 2020-07-25 ENCOUNTER — Other Ambulatory Visit (HOSPITAL_COMMUNITY)
Admission: RE | Admit: 2020-07-25 | Discharge: 2020-07-25 | Disposition: A | Discharge status: Home / Self Care | Payer: BC Managed Care – PPO | Source: Ambulatory Visit | Attending: Surgery | Admitting: Surgery

## 2020-07-25 ENCOUNTER — Encounter (HOSPITAL_COMMUNITY): Payer: Self-pay

## 2020-07-25 DIAGNOSIS — Z01818 Encounter for other preprocedural examination: Secondary | ICD-10-CM | POA: Diagnosis not present

## 2020-07-25 DIAGNOSIS — Z20822 Contact with and (suspected) exposure to covid-19: Secondary | ICD-10-CM | POA: Insufficient documentation

## 2020-07-25 HISTORY — DX: Type 2 diabetes mellitus without complications: E11.9

## 2020-07-25 HISTORY — DX: Anxiety disorder, unspecified: F41.9

## 2020-07-25 HISTORY — DX: Other complications of anesthesia, initial encounter: T88.59XA

## 2020-07-25 LAB — CBC
HCT: 42 % (ref 36.0–46.0)
Hemoglobin: 15.1 g/dL — ABNORMAL HIGH (ref 12.0–15.0)
MCH: 34.2 pg — ABNORMAL HIGH (ref 26.0–34.0)
MCHC: 36 g/dL (ref 30.0–36.0)
MCV: 95 fL (ref 80.0–100.0)
Platelets: 261 10*3/uL (ref 150–400)
RBC: 4.42 MIL/uL (ref 3.87–5.11)
RDW: 12.1 % (ref 11.5–15.5)
WBC: 8.4 10*3/uL (ref 4.0–10.5)
nRBC: 0 % (ref 0.0–0.2)

## 2020-07-25 LAB — BASIC METABOLIC PANEL
Anion gap: 9 (ref 5–15)
BUN: <5 mg/dL — ABNORMAL LOW (ref 6–20)
CO2: 26 mmol/L (ref 22–32)
Calcium: 9.6 mg/dL (ref 8.9–10.3)
Chloride: 102 mmol/L (ref 98–111)
Creatinine, Ser: 0.69 mg/dL (ref 0.44–1.00)
GFR, Estimated: >60 mL/min (ref >60)
Glucose, Bld: 164 mg/dL — ABNORMAL HIGH (ref 70–99)
Potassium: 3.9 mmol/L (ref 3.5–5.1)
Sodium: 137 mmol/L (ref 135–145)

## 2020-07-25 LAB — SARS CORONAVIRUS 2 (TAT 6-24 HRS): SARS Coronavirus 2: NEGATIVE

## 2020-07-25 LAB — GLUCOSE, CAPILLARY: Glucose-Capillary: 175 mg/dL — ABNORMAL HIGH (ref 70–99)

## 2020-07-25 NOTE — Progress Notes (Addendum)
Anesthesia Review:  PCP: DR Sigmund Hazel  Reqeuested LOV on 07/25/20 They are to fax  On chart dated 06/19/20  Cardiologist : none  Chest x-ray : EKG : 07/25/2020  Echo : Stress test: Cardiac Cath :  Activity level: can do a flight of stairs without difficulty  Sleep Study/ CPAP : none  Fasting Blood Sugar :      / Checks Blood Sugar -- times a day:   Blood Thinner/ Instructions /Last Dose: ASA / Instructions/ Last Dose :   DM- type 2 diagnosed 06/2020   In ED 06/14/20 with hyperglycemia Checks glucose twice daily  06/14/20-hgba1c-10.1- routed to Dr Dossie Der on 07/25/2020  PT reports on preop of 6/126/22 that anesthesia " makjes her crazy ".

## 2020-07-28 MED ORDER — BUPIVACAINE LIPOSOME 1.3 % IJ SUSP
20.0000 mL | INTRAMUSCULAR | Status: DC
  Filled 2020-07-28: qty 20

## 2020-07-29 ENCOUNTER — Ambulatory Visit (HOSPITAL_COMMUNITY)
Admission: RE | Admit: 2020-07-29 | Discharge: 2020-07-29 | Disposition: A | Discharge status: Home / Self Care | Payer: BC Managed Care – PPO | Attending: Surgery | Admitting: Surgery

## 2020-07-29 ENCOUNTER — Encounter (HOSPITAL_COMMUNITY): Payer: Self-pay | Admitting: Surgery

## 2020-07-29 ENCOUNTER — Other Ambulatory Visit: Payer: Self-pay

## 2020-07-29 ENCOUNTER — Ambulatory Visit (HOSPITAL_COMMUNITY): Payer: BC Managed Care – PPO | Admitting: Anesthesiology

## 2020-07-29 ENCOUNTER — Ambulatory Visit (HOSPITAL_COMMUNITY): Payer: BC Managed Care – PPO | Admitting: Physician Assistant

## 2020-07-29 ENCOUNTER — Encounter (HOSPITAL_COMMUNITY)
Admission: RE | Disposition: A | Discharge status: Home / Self Care | Payer: Self-pay | Source: Home / Self Care | Attending: Surgery

## 2020-07-29 DIAGNOSIS — Z79899 Other long term (current) drug therapy: Secondary | ICD-10-CM | POA: Diagnosis not present

## 2020-07-29 DIAGNOSIS — F17210 Nicotine dependence, cigarettes, uncomplicated: Secondary | ICD-10-CM | POA: Insufficient documentation

## 2020-07-29 DIAGNOSIS — K439 Ventral hernia without obstruction or gangrene: Secondary | ICD-10-CM | POA: Diagnosis not present

## 2020-07-29 DIAGNOSIS — Z7984 Long term (current) use of oral hypoglycemic drugs: Secondary | ICD-10-CM | POA: Diagnosis not present

## 2020-07-29 DIAGNOSIS — Z79890 Hormone replacement therapy (postmenopausal): Secondary | ICD-10-CM | POA: Insufficient documentation

## 2020-07-29 HISTORY — PX: EPIGASTRIC HERNIA REPAIR: SHX404

## 2020-07-29 LAB — GLUCOSE, CAPILLARY
Glucose-Capillary: 141 mg/dL — ABNORMAL HIGH (ref 70–99)
Glucose-Capillary: 133 mg/dL — ABNORMAL HIGH (ref 70–99)

## 2020-07-29 SURGERY — REPAIR, HERNIA, EPIGASTRIC, ADULT
Anesthesia: General | Site: Abdomen

## 2020-07-29 MED ORDER — FENTANYL CITRATE (PF) 100 MCG/2ML IJ SOLN
25.0000 ug | INTRAMUSCULAR | Status: DC | PRN
  Administered 2020-07-29: 50 ug via INTRAVENOUS

## 2020-07-29 MED ORDER — OXYCODONE HCL 5 MG/5ML PO SOLN: 5.0000 mg | Freq: Once | ORAL | Status: AC | PRN

## 2020-07-29 MED ORDER — CHLORHEXIDINE GLUCONATE CLOTH 2 % EX PADS: 6.0000 | MEDICATED_PAD | Freq: Once | CUTANEOUS | Status: DC

## 2020-07-29 MED ORDER — MIDAZOLAM HCL 2 MG/2ML IJ SOLN
INTRAMUSCULAR | Status: AC
  Filled 2020-07-29: qty 2

## 2020-07-29 MED ORDER — FENTANYL CITRATE (PF) 250 MCG/5ML IJ SOLN
INTRAMUSCULAR | Status: AC
  Filled 2020-07-29: qty 5

## 2020-07-29 MED ORDER — PROPOFOL 10 MG/ML IV BOLUS
INTRAVENOUS | Status: DC | PRN
  Administered 2020-07-29: 50 mg via INTRAVENOUS
  Administered 2020-07-29: 150 mg via INTRAVENOUS

## 2020-07-29 MED ORDER — ORAL CARE MOUTH RINSE: 15.0000 mL | Freq: Once | OROMUCOSAL | Status: DC

## 2020-07-29 MED ORDER — OXYCODONE HCL 5 MG PO TABS
ORAL_TABLET | ORAL | Status: AC
  Filled 2020-07-29: qty 1

## 2020-07-29 MED ORDER — ACETAMINOPHEN 500 MG PO TABS
1000.0000 mg | ORAL_TABLET | ORAL | Status: AC
  Administered 2020-07-29: 1000 mg via ORAL
  Filled 2020-07-29: qty 2

## 2020-07-29 MED ORDER — BUPIVACAINE LIPOSOME 1.3 % IJ SUSP
INTRAMUSCULAR | Status: DC | PRN
  Administered 2020-07-29: 20 mL

## 2020-07-29 MED ORDER — BUPIVACAINE-EPINEPHRINE 0.25% -1:200000 IJ SOLN
INTRAMUSCULAR | Status: DC | PRN
  Administered 2020-07-29: 30 mL

## 2020-07-29 MED ORDER — ONDANSETRON HCL 4 MG/2ML IJ SOLN
INTRAMUSCULAR | Status: DC | PRN
  Administered 2020-07-29: 4 mg via INTRAVENOUS

## 2020-07-29 MED ORDER — LACTATED RINGERS IV SOLN: INTRAVENOUS | Status: DC

## 2020-07-29 MED ORDER — PHENYLEPHRINE 40 MCG/ML (10ML) SYRINGE FOR IV PUSH (FOR BLOOD PRESSURE SUPPORT)
PREFILLED_SYRINGE | INTRAVENOUS | Status: DC | PRN
  Administered 2020-07-29: 80 ug via INTRAVENOUS

## 2020-07-29 MED ORDER — CEFAZOLIN SODIUM-DEXTROSE 2-4 GM/100ML-% IV SOLN
2.0000 g | INTRAVENOUS | Status: AC
  Administered 2020-07-29: 2 g via INTRAVENOUS
  Filled 2020-07-29: qty 100

## 2020-07-29 MED ORDER — LIDOCAINE 2% (20 MG/ML) 5 ML SYRINGE
INTRAMUSCULAR | Status: DC | PRN
  Administered 2020-07-29: 60 mg via INTRAVENOUS

## 2020-07-29 MED ORDER — ROCURONIUM BROMIDE 10 MG/ML (PF) SYRINGE
PREFILLED_SYRINGE | INTRAVENOUS | Status: AC
  Filled 2020-07-29: qty 10

## 2020-07-29 MED ORDER — OXYCODONE-ACETAMINOPHEN 5-325 MG PO TABS: 1.0000 | ORAL_TABLET | ORAL | 0 refills | Status: AC | PRN

## 2020-07-29 MED ORDER — OXYCODONE HCL 5 MG PO TABS
5.0000 mg | ORAL_TABLET | Freq: Once | ORAL | Status: AC | PRN
  Administered 2020-07-29: 5 mg via ORAL

## 2020-07-29 MED ORDER — SUGAMMADEX SODIUM 200 MG/2ML IV SOLN
INTRAVENOUS | Status: DC | PRN
  Administered 2020-07-29: 200 mg via INTRAVENOUS

## 2020-07-29 MED ORDER — FENTANYL CITRATE (PF) 250 MCG/5ML IJ SOLN
INTRAMUSCULAR | Status: DC | PRN
  Administered 2020-07-29 (×2): 100 ug via INTRAVENOUS
  Administered 2020-07-29: 50 ug via INTRAVENOUS

## 2020-07-29 MED ORDER — 0.9 % SODIUM CHLORIDE (POUR BTL) OPTIME
TOPICAL | Status: DC | PRN
  Administered 2020-07-29: 1000 mL

## 2020-07-29 MED ORDER — CHLORHEXIDINE GLUCONATE 0.12 % MT SOLN: 15.0000 mL | Freq: Once | OROMUCOSAL | Status: DC

## 2020-07-29 MED ORDER — KETOROLAC TROMETHAMINE 30 MG/ML IJ SOLN: 30.0000 mg | Freq: Once | INTRAMUSCULAR | Status: DC | PRN

## 2020-07-29 MED ORDER — FENTANYL CITRATE (PF) 100 MCG/2ML IJ SOLN
INTRAMUSCULAR | Status: AC
  Filled 2020-07-29: qty 2

## 2020-07-29 MED ORDER — CELECOXIB 200 MG PO CAPS
400.0000 mg | ORAL_CAPSULE | ORAL | Status: AC
  Administered 2020-07-29: 400 mg via ORAL
  Filled 2020-07-29: qty 2

## 2020-07-29 MED ORDER — DEXAMETHASONE SODIUM PHOSPHATE 10 MG/ML IJ SOLN
INTRAMUSCULAR | Status: DC | PRN
  Administered 2020-07-29: 5 mg via INTRAVENOUS

## 2020-07-29 MED ORDER — BUPIVACAINE-EPINEPHRINE (PF) 0.25% -1:200000 IJ SOLN
INTRAMUSCULAR | Status: AC
  Filled 2020-07-29: qty 30

## 2020-07-29 MED ORDER — AMISULPRIDE (ANTIEMETIC) 5 MG/2ML IV SOLN: 10.0000 mg | Freq: Once | INTRAVENOUS | Status: DC | PRN

## 2020-07-29 MED ORDER — LIDOCAINE 2% (20 MG/ML) 5 ML SYRINGE
INTRAMUSCULAR | Status: AC
  Filled 2020-07-29: qty 5

## 2020-07-29 MED ORDER — DEXAMETHASONE SODIUM PHOSPHATE 10 MG/ML IJ SOLN
INTRAMUSCULAR | Status: AC
  Filled 2020-07-29: qty 1

## 2020-07-29 MED ORDER — MIDAZOLAM HCL 5 MG/5ML IJ SOLN
INTRAMUSCULAR | Status: DC | PRN
  Administered 2020-07-29: 2 mg via INTRAVENOUS

## 2020-07-29 MED ORDER — ROCURONIUM BROMIDE 10 MG/ML (PF) SYRINGE
PREFILLED_SYRINGE | INTRAVENOUS | Status: DC | PRN
  Administered 2020-07-29: 20 mg via INTRAVENOUS
  Administered 2020-07-29: 30 mg via INTRAVENOUS

## 2020-07-29 MED ORDER — PROMETHAZINE HCL 25 MG/ML IJ SOLN: 6.2500 mg | INTRAMUSCULAR | Status: DC | PRN

## 2020-07-29 MED ORDER — GABAPENTIN 300 MG PO CAPS
300.0000 mg | ORAL_CAPSULE | ORAL | Status: AC
  Administered 2020-07-29: 300 mg via ORAL
  Filled 2020-07-29: qty 1

## 2020-07-29 MED ORDER — PHENYLEPHRINE 40 MCG/ML (10ML) SYRINGE FOR IV PUSH (FOR BLOOD PRESSURE SUPPORT)
PREFILLED_SYRINGE | INTRAVENOUS | Status: AC
  Filled 2020-07-29: qty 10

## 2020-07-29 MED ORDER — ONDANSETRON HCL 4 MG/2ML IJ SOLN
INTRAMUSCULAR | Status: AC
  Filled 2020-07-29: qty 2

## 2020-07-29 SURGICAL SUPPLY — 32 items
BENZOIN TINCTURE PRP APPL 2/3 (GAUZE/BANDAGES/DRESSINGS) IMPLANT
CHLORAPREP W/TINT 26 (MISCELLANEOUS) ×2 IMPLANT
COVER SURGICAL LIGHT HANDLE (MISCELLANEOUS) ×2 IMPLANT
COVER WAND RF STERILE (DRAPES) IMPLANT
DECANTER SPIKE VIAL GLASS SM (MISCELLANEOUS) ×2 IMPLANT
DERMABOND ADVANCED (GAUZE/BANDAGES/DRESSINGS) ×1
DERMABOND ADVANCED .7 DNX12 (GAUZE/BANDAGES/DRESSINGS) ×1 IMPLANT
DRAPE LAPAROSCOPIC ABDOMINAL (DRAPES) ×2 IMPLANT
ELECT REM PT RETURN 15FT ADLT (MISCELLANEOUS) ×2 IMPLANT
GAUZE SPONGE 4X4 12PLY STRL (GAUZE/BANDAGES/DRESSINGS) IMPLANT
GLOVE SURG ENC MOIS LTX SZ6 (GLOVE) ×2 IMPLANT
GLOVE SURG UNDER LTX SZ6.5 (GLOVE) ×2 IMPLANT
GOWN STRL REUS W/TWL LRG LVL3 (GOWN DISPOSABLE) ×2 IMPLANT
GOWN STRL REUS W/TWL XL LVL3 (GOWN DISPOSABLE) ×2 IMPLANT
KIT BASIN OR (CUSTOM PROCEDURE TRAY) ×2 IMPLANT
KIT TURNOVER KIT A (KITS) ×2 IMPLANT
MESH BARD SOFT 6X6IN (Mesh General) ×2 IMPLANT
NEEDLE HYPO 22GX1.5 SAFETY (NEEDLE) IMPLANT
PACK GENERAL/GYN (CUSTOM PROCEDURE TRAY) ×2 IMPLANT
PENCIL SMOKE EVACUATOR (MISCELLANEOUS) IMPLANT
STRIP CLOSURE SKIN 1/2X4 (GAUZE/BANDAGES/DRESSINGS) IMPLANT
SUT ETHIBOND 0 MO6 C/R (SUTURE) IMPLANT
SUT MNCRL AB 4-0 PS2 18 (SUTURE) ×2 IMPLANT
SUT PDS AB 2-0 CT2 27 (SUTURE) ×4 IMPLANT
SUT PROLENE 2 0 CT2 30 (SUTURE) IMPLANT
SUT VIC AB 2-0 SH 27 (SUTURE) ×1
SUT VIC AB 2-0 SH 27XBRD (SUTURE) ×1 IMPLANT
SUT VIC AB 3-0 SH 27 (SUTURE) ×1
SUT VIC AB 3-0 SH 27XBRD (SUTURE) ×1 IMPLANT
SYR CONTROL 10ML LL (SYRINGE) IMPLANT
TOWEL OR 17X26 10 PK STRL BLUE (TOWEL DISPOSABLE) ×2 IMPLANT
TOWEL OR NON WOVEN STRL DISP B (DISPOSABLE) ×2 IMPLANT

## 2020-07-29 NOTE — Op Note (Signed)
   Patient: Alicia Salas ([birth year], [id number])  Date of Surgery: 07/29/2020   Preoperative Diagnosis: EPIGASTRIC HERNIA   Postoperative Diagnosis: EPIGASTRIC HERNIA   Surgical Procedure: OPEN EPIGASTRIC HERNIA REPAIR WITH MESH:    Operative Team Members:  Surgeon(s) and Role:    * Sidney Kann, Hyman Hopes, MD - Primary   Anesthesiologist: Leonides Grills, MD CRNA: Elyn Peers, CRNA; Elisabeth Cara, CRNA   Anesthesia: General   Fluids:  Total I/O In: 720 [I.V.:620; IV Piggyback:100] Out: 15 [Blood:15]  Complications: None  Drains:  none   Specimen: none  Disposition:  PACU - hemodynamically stable.  Plan of Care: Discharge to home after PACU    Indications for Procedure:[name] is an 55 y.o. female with an epigastric hernia here for elective repair.  The procedure of epigastric hernia repair, possibly with mesh, was described for the patient.  The procedure itself as well as its risks, benefits and alternatives were discussed.  The patient granted consent to proceed. We will proceed to the OR as scheduled.   Findings:  Hernia Location: Epigastric Hernia Size:  1cm tall x 2 cm wide Mesh Size &Type:  7 cm tall x 6 cm wide Mesh Position: Preperitoneal  Description of Procedure:  The patient was positioned supine, padded and secured to the bed.  The abdomen was widely prepped and draped.  A time out procedure was performed.   A midline epigastric incision was made over the hernia.  Dissection was carried down through the subcutaneous tissue to the level of the fascia.  The hernia defect was measured as 2cm wide by 1 cm tall.  The hernia contained fatty preperitoneal tissue and omentum.  A high division of this fatty tissue was performed using electrocautery.  An epigastric hernia repair with mesh was performed.  The hernia sac was scored along the perimeter of the fascial defect, entering the pre peritoneal plane.  A wide pre peritoneal dissection was  performed creating a space for mesh placement.  The peritoneum was repaired using 3-0 vicryl suture to recreate the visceral sac.  A piece of Bard Soft mesh was opened and trimmed to 7cm tall x 6cm wide and deployed into the pre peritoneal space.  The mesh laid in taut position in the pre peritoneal plane and did not require fixation.  The space was irrigated with normal saline.  A rectus sheath block was performed bilaterally with marcaine and exparel.  The fascial defect was closed horizontally, overtop the mesh with running 2-0 PDS suture.   The skin was closed with 4-0 Monocryl subcuticular suture and skin glue.   Ivar Drape, MD General, Bariatric, & Minimally Invasive Surgery [facility], [place]

## 2020-07-29 NOTE — Anesthesia Preprocedure Evaluation (Signed)
Anesthesia Evaluation  Patient identified by MRN, date of birth, ID band Patient awake    Reviewed: Allergy & Precautions, NPO status , Patient's Chart, lab work & pertinent test results  Airway Mallampati: II  TM Distance: >3 FB Neck ROM: Full    Dental no notable dental hx.    Pulmonary Current SmokerPatient did not abstain from smoking.,    Pulmonary exam normal breath sounds clear to auscultation       Cardiovascular negative cardio ROS Normal cardiovascular exam Rhythm:Regular Rate:Normal  ECG: NSR, rate 79   Neuro/Psych Anxiety negative neurological ROS     GI/Hepatic Neg liver ROS, GERD  Medicated and Controlled,  Endo/Other  diabetes, Oral Hypoglycemic Agents  Renal/GU negative Renal ROS     Musculoskeletal negative musculoskeletal ROS (+)   Abdominal   Peds  Hematology negative hematology ROS (+)   Anesthesia Other Findings EPIGASTRIC HERNIA  Reproductive/Obstetrics                             Anesthesia Physical Anesthesia Plan  ASA: 3  Anesthesia Plan: General   Post-op Pain Management:    Induction: Intravenous  PONV Risk Score and Plan: 2 and Ondansetron, Dexamethasone, Midazolam and Treatment may vary due to age or medical condition  Airway Management Planned: Oral ETT  Additional Equipment:   Intra-op Plan:   Post-operative Plan: Extubation in OR  Informed Consent: I have reviewed the patients History and Physical, chart, labs and discussed the procedure including the risks, benefits and alternatives for the proposed anesthesia with the patient or authorized representative who has indicated his/her understanding and acceptance.     Dental advisory given  Plan Discussed with: CRNA  Anesthesia Plan Comments:         Anesthesia Quick Evaluation

## 2020-07-29 NOTE — H&P (Signed)
   Admitting Physician: Hyman Hopes Kong Packett  Service: General surgery  CC: epigastric hernia  Subjective   HPI: Alicia Salas is an 55 y.o. female who is here for epigastric hernia repair  Past Medical History:  Diagnosis Date   Anxiety    Complication of anesthesia    Diabetes mellitus without complication (HCC)     Past Surgical History:  Procedure Laterality Date   ABDOMINAL HYSTERECTOMY     BREAST SURGERY     TONSILLECTOMY      History reviewed. No pertinent family history.  Social:  reports that she has been smoking cigarettes. She has a 10.00 pack-year smoking history. She has never used smokeless tobacco. She reports current alcohol use. She reports that she does not use drugs.  Allergies: No Known Allergies  Medications: Current Outpatient Medications  Medication Instructions   esomeprazole (NEXIUM) 20 mg, Oral, Daily, Pt takes 2 in the am and 2 at nite   estradiol (ESTRACE) 1 mg, Oral, Daily   metFORMIN (GLUCOPHAGE) 500 mg, Oral, 2 times daily with meals   Multiple Vitamins-Minerals (MULTIVITAMIN WITH MINERALS) tablet 1 tablet, Oral, Daily   oxymetazoline (AFRIN) 0.05 % nasal spray 1 spray, Each Nare, 2 times daily PRN   Pseudoephedrine-Naproxen Na (ALEVE SINUS & HEADACHE PO) 1 tablet, Oral, Daily PRN   vitamin B-12 (CYANOCOBALAMIN) 1,000 mcg, Oral, Daily    ROS - all of the below systems have been reviewed with the patient and positives are indicated with bold text General: chills, fever or night sweats Eyes: blurry vision or double vision ENT: epistaxis or sore throat Allergy/Immunology: itchy/watery eyes or nasal congestion Hematologic/Lymphatic: bleeding problems, blood clots or swollen lymph nodes Endocrine: temperature intolerance or unexpected weight changes Breast: new or changing breast lumps or nipple discharge Resp: cough, shortness of breath, or wheezing CV: chest pain or dyspnea on exertion GI: as per HPI GU: dysuria, trouble voiding, or  hematuria MSK: joint pain or joint stiffness Neuro: TIA or stroke symptoms Derm: pruritus and skin lesion changes Psych: anxiety and depression  Objective   PE Blood pressure (!) 150/99, pulse 72, temperature 98 F (36.7 C), temperature source Oral, resp. rate 15, height 5\' 5"  (1.651 m), weight 67.6 kg, SpO2 96 %. Constitutional: NAD; conversant; no deformities Eyes: Moist conjunctiva; no lid lag; anicteric; PERRL Neck: Trachea midline; no thyromegaly Lungs: Normal respiratory effort; no tactile fremitus CV: RRR; no palpable thrills; no pitting edema GI: Abd epigastric fatty hernia, unable to fully reduce or palpate fascial defect; no palpable hepatosplenomegaly MSK: Normal range of motion of extremities; no clubbing/cyanosis Psychiatric: Appropriate affect; alert and oriented x3 Lymphatic: No palpable cervical or axillary lymphadenopathy  Results for orders placed or performed during the hospital encounter of 07/29/20 (from the past 24 hour(s))  Glucose, capillary     Status: Abnormal   Collection Time: 07/29/20 12:42 PM  Result Value Ref Range   Glucose-Capillary 141 (H) 70 - 99 mg/dL    Imaging Orders  No imaging studies ordered today     Assessment and Plan   Alicia Salas is an 55 y.o. female with an epigastric hernia here for elective repair.  The procedure of epigastric hernia repair, possibly with mesh, was described for the patient.  The procedure itself as well as its risks, benefits and alternatives were discussed.  The patient granted consent to proceed. We will proceed to the OR as scheduled.  40, MD  [facility] Use [email or web address] to contact on call provider

## 2020-07-29 NOTE — Transfer of Care (Signed)
Immediate Anesthesia Transfer of Care Note  Patient: Alicia Salas  Procedure(s) Performed: OPEN EPIGASTRIC HERNIA REPAIR WITH MESH (Abdomen)  Patient Location: PACU  Anesthesia Type:General  Level of Consciousness: awake, alert  and oriented  Airway & Oxygen Therapy: Patient Spontanous Breathing and Patient connected to face mask oxygen  Post-op Assessment: Report given to RN and Post -op Vital signs reviewed and stable  Post vital signs: Reviewed and stable  Last Vitals:  Vitals Value Taken Time  BP 160/86 07/29/20 1441  Temp    Pulse 86 07/29/20 1443  Resp 13 07/29/20 1443  SpO2 100 % 07/29/20 1443  Vitals shown include unvalidated device data.  Last Pain:  Vitals:   07/29/20 1245  TempSrc: Oral         Complications: No notable events documented.

## 2020-07-29 NOTE — Discharge Instructions (Signed)
 VENTRAL HERNIA REPAIR POST OPERATIVE INSTRUCTIONS  Thinking Clearly  The anesthesia may cause you to feel different for 1 or 2 days. Do not drive, drink alcohol, or make any big decisions for at least 2 days.  Nutrition When you wake up, you will be able to drink small amounts of liquid. If you do not feel sick, you can slowly advance your diet to regular foods. Continue to drink lots of fluids, usually about 8 to 10 glasses per day. Eat a high-fiber diet so you don't strain during bowel movements. High-Fiber Foods Foods high in fiber include beans, bran cereals and whole-grain breads, peas, dried fruit (figs, apricots, and dates), raspberries, blackberries, strawberries, sweet corn, broccoli, baked potatoes with skin, plums, pears, apples, greens, and nuts. Activity Slowly increase your activity. Be sure to get up and walk every hour or so to prevent blood clots. No heavy lifting or strenuous activity for 4 weeks following surgery to prevent hernias at your incision sites or recurrence of your hernia. It is normal to feel tired. You may need more sleep than usual.  Get your rest but make sure to get up and move around frequently to prevent blood clots and pneumonia.  Work and Return to School You can go back to work when you feel well enough. Discuss the timing with your surgeon. You can usually go back to school or work 1 week or less after an laparoscopic or an open repair. If your work requires heavy lifting or strenuous activity you need to be placed on light duty for 4 weeks following surgery. You can return to gym class, sports or other physical activities 4 weeks after surgery.  Wound Care You may experience significant bruising throughout the abdominal wall that may track down into the groin including into the scrotum in males.  Rest, elevating the groin and scrotum above the level of the heart, ice and compression with tight fitting underwear or an abdominal binder can help.   Always wash your hands before and after touching near your incision site. Do not soak in a bathtub until cleared at your follow up appointment. You may take a shower 24 hours after surgery. A small amount of drainage from the incision is normal. If the drainage is thick and yellow or the site is red, you may have an infection, so call your surgeon. If you have a drain in one of your incisions, it will be taken out in office when the drainage stops. Steri-Strips will fall off in 7 to 10 days or they will be removed during your first office visit. If you have dermabond glue covering over the incision, allow the glue to flake off on its own. Protect the new skin, especially from the sun. The sun can burn and cause darker scarring. Your scar will heal in about 4 to 6 weeks and will become softer and continue to fade over the next year.  The cosmetic appearance of the incisions will improve over the course of the first year after surgery. Sensation around your incision will return in a few weeks or months.  Bowel Movements After intestinal surgery, you may have loose watery stools for several days. If watery diarrhea lasts longer than 3 days, contact your surgeon. Pain medication (narcotics) can cause constipation. Increase the fiber in your diet with high-fiber foods if you are constipated. You can take an over the counter stool softener like Colace to avoid constipation.  Additional over the counter medications can also be used   if Colace isn't sufficient (for example, Milk of Magnesia or Miralax).  Pain The amount of pain is different for each person. Some people need only 1 to 3 doses of pain control medication, while others need more. Take alternating doses of tylenol and ibuprofen around the clock for the first five days following surgery.  This will provide a baseline of pain control and help with inflammation.  Take the narcotic pain medication in addition if needed for severe pain.  Contact  Your Surgeon at [phone], if you have: Pain that will not go away Pain that gets worse A fever of more than 101F (38.3C) Repeated vomiting Swelling, redness, bleeding, or bad-smelling drainage from your wound site Strong abdominal pain No bowel movement or unable to pass gas for 3 days Watery diarrhea lasting longer than 3 days  Pain Control The goal of pain control is to minimize pain, keep you moving and help you heal. Your surgical team will work with you on your pain plan. Most often a combination of therapies and medications are used to control your pain. You may also be given medication (local anesthetic) at the surgical site. This may help control your pain for several days. Extreme pain puts extra stress on your body at a time when your body needs to focus on healing. Do not wait until your pain has reached a level "10" or is unbearable before telling your doctor or nurse. It is much easier to control pain before it becomes severe. Following a laparoscopic procedure, pain is sometimes felt in the shoulder. This is due to the gas inserted into your abdomen during the procedure. Moving and walking helps to decrease the gas and the right shoulder pain.  Use the guide below for ways to manage your post-operative pain. Learn more by going to facs.org/safepaincontrol.  How Intense Is My Pain Common Therapies to Feel Better       I hardly notice my pain, and it does not interfere with my activities.  I notice my pain and it distracts me, but I can still do activities (sitting up, walking, standing).  Non-Medication Therapies  Ice (in a bag, applied over clothing at the surgical site), elevation, rest, meditation, massage, distraction (music, TV, play) walking and mild exercise Splinting the abdomen with pillows +  Non-Opioid Medications Acetaminophen (Tylenol) Non-steroidal anti-inflammatory drugs (NSAIDS) Aspirin, Ibuprofen (Motrin, Advil) Naproxen (Aleve) Take these as  needed, when you feel pain. Both acetaminophen and NSAIDs help to decrease pain and swelling (inflammation).      My pain is hard to ignore and is more noticeable even when I rest.  My pain interferes with my usual activities.  Non-Medication Therapies  +  Non-Opioid medications  Take on a regular schedule (around-the-clock) instead of as needed. (For example, Tylenol every 6 hours at 9:00 am, 3:00 pm, 9:00 pm, 3:00 am and Motrin every 6 hours at 12:00 am, 6:00 am, 12:00 pm, 6:00 pm)         I am focused on my pain, and I am not doing my daily activities.  I am groaning in pain, and I cannot sleep. I am unable to do anything.  My pain is as bad as it could be, and nothing else matters.  Non-Medication Therapies  +  Around-the-Clock Non-Opioid Medications  +  Short-acting opioids  Opioids should be used with other medications to manage severe pain. Opioids block pain and give a feeling of euphoria (feel high). Addiction, a serious side effect of opioids, is   rare with short-term (a few days) use.  Examples of short-acting opioids include: Tramadol (Ultram), Hydrocodone (Norco, Vicodin), Hydromorphone (Dilaudid), Oxycodone (Oxycontin)     The above directions have been adapted from the American [employer] Surgical Patient Education Program.  Please refer to the ACS website if needed: https://www.facs.org/-/media/files/education/patient-ed/[email or web address]_[email or web address].ashx   Alvey Brockel, MD [facility] [address], [address], [place], [place]  [address] ?  [address] [address], [place], [place]   [address] [phone] ? [phone] ? FAX [phone] Web site: [email or web address]  

## 2020-07-29 NOTE — Anesthesia Procedure Notes (Signed)
Procedure Name: Intubation Date/Time: 07/29/2020 1:31 PM Performed by: Lollie Sails, CRNA Pre-anesthesia Checklist: Patient identified, Emergency Drugs available, Suction available, Patient being monitored and Timeout performed Patient Re-evaluated:Patient Re-evaluated prior to induction Oxygen Delivery Method: Circle system utilized Preoxygenation: Pre-oxygenation with 100% oxygen Induction Type: IV induction Ventilation: Mask ventilation without difficulty Laryngoscope Size: Mac and 3 Grade View: Grade I Tube size: 7.5 mm Number of attempts: 1 Airway Equipment and Method: Stylet Placement Confirmation: positive ETCO2, ETT inserted through vocal cords under direct vision, CO2 detector and breath sounds checked- equal and bilateral Secured at: 22 cm Dental Injury: Teeth and Oropharynx as per pre-operative assessment  Comments: DL x1 by Charyl Bigger, SRNA

## 2020-07-29 NOTE — Progress Notes (Signed)
   Patient: Alicia Salas (January 31, 1966, [id number])  Date of Surgery: 07/29/2020   Preoperative Diagnosis: EPIGASTRIC HERNIA   Postoperative Diagnosis: EPIGASTRIC HERNIA   Surgical Procedure: OPEN EPIGASTRIC HERNIA REPAIR WITH MESH:    Operative Team Members:  Surgeon(s) and Role:    * Caelin Rosen, Hyman Hopes, MD - Primary   Anesthesiologist: Leonides Grills, MD CRNA: Elyn Peers, CRNA; Elisabeth Cara, CRNA   Anesthesia: General   Fluids:  Total I/O In: -  Out: 15 [Blood:15]  Complications: None  Drains:  none   Specimen: none  Disposition:  PACU - hemodynamically stable.  Plan of Care: Discharge to home after PACU    Indications for Procedure:Alicia Salas is an 55 y.o. female with an epigastric hernia here for elective repair.  The procedure of epigastric hernia repair, possibly with mesh, was described for the patient.  The procedure itself as well as its risks, benefits and alternatives were discussed.  The patient granted consent to proceed. We will proceed to the OR as scheduled.   Findings:  Hernia Location: Epigastric Hernia Size:  1cm tall x 2 cm wide Mesh Size &Type:  7 cm tall x 6 cm wide Mesh Position: Preperitoneal  Description of Procedure:  The patient was positioned supine, padded and secured to the bed.  The abdomen was widely prepped and draped.  A time out procedure was performed.   A midline epigastric incision was made over the hernia.  Dissection was carried down through the subcutaneous tissue to the level of the fascia.  The hernia defect was measured as 2cm wide by 1 cm tall.  The hernia contained fatty preperitoneal tissue and omentum.  A high division of this fatty tissue was performed using electrocautery.  An epigastric hernia repair with mesh was performed.  The hernia sac was scored along the perimeter of the fascial defect, entering the pre peritoneal plane.  A wide pre peritoneal dissection was performed creating a space for mesh  placement.  The peritoneum was repaired using 3-0 vicryl suture to recreate the visceral sac.  A piece of Bard Soft mesh was opened and trimmed to 7cm tall x 6cm wide and deployed into the pre peritoneal space.  The mesh laid in taut position in the pre peritoneal plane and did not require fixation.  The space was irrigated with normal saline.  A rectus sheath block was performed bilaterally with marcaine and exparel.  The fascial defect was closed horizontally, overtop the mesh with running 2-0 PDS suture.   The skin was closed with 4-0 Monocryl subcuticular suture and skin glue.   Ivar Drape, MD General, Bariatric, & Minimally Invasive Surgery [facility], [place]

## 2020-07-29 NOTE — Anesthesia Postprocedure Evaluation (Signed)
Anesthesia Post Note  Patient: Alicia Salas  Procedure(s) Performed: OPEN EPIGASTRIC HERNIA REPAIR WITH MESH (Abdomen)     Patient location during evaluation: PACU Anesthesia Type: General Level of consciousness: awake Pain management: pain level controlled Vital Signs Assessment: post-procedure vital signs reviewed and stable Respiratory status: spontaneous breathing, nonlabored ventilation, respiratory function stable and patient connected to nasal cannula oxygen Cardiovascular status: blood pressure returned to baseline and stable Postop Assessment: no apparent nausea or vomiting Anesthetic complications: no   No notable events documented.  Last Vitals:  Vitals:   07/29/20 1542 07/29/20 1600  BP: (!) 158/95 (!) 161/89  Pulse: 75 69  Resp: 16 16  Temp: 36.8 C   SpO2: 99% 98%    Last Pain:  Vitals:   07/29/20 1600  TempSrc:   PainSc: 3                  Jla Reynolds P Odella Appelhans

## 2020-07-30 ENCOUNTER — Encounter (HOSPITAL_COMMUNITY): Payer: Self-pay | Admitting: Surgery

## 2020-09-05 ENCOUNTER — Ambulatory Visit

## 2020-09-05 DIAGNOSIS — I10 Essential (primary) hypertension: Principal | ICD-10-CM

## 2020-09-05 DIAGNOSIS — M5030 Other cervical disc degeneration, unspecified cervical region: Secondary | ICD-10-CM

## 2020-09-05 DIAGNOSIS — D649 Anemia, unspecified: Secondary | ICD-10-CM

## 2020-09-05 DIAGNOSIS — G709 Myoneural disorder, unspecified: Secondary | ICD-10-CM

## 2020-09-05 DIAGNOSIS — J45909 Unspecified asthma, uncomplicated: Secondary | ICD-10-CM

## 2020-09-05 DIAGNOSIS — M170 Bilateral primary osteoarthritis of knee: Principal | ICD-10-CM

## 2020-09-05 DIAGNOSIS — M1990 Unspecified osteoarthritis, unspecified site: Secondary | ICD-10-CM

## 2020-09-27 ENCOUNTER — Ambulatory Visit: Attending: Family

## 2020-09-27 DIAGNOSIS — Z6838 Body mass index (BMI) 38.0-38.9, adult: Secondary | ICD-10-CM

## 2020-09-27 DIAGNOSIS — D649 Anemia, unspecified: Secondary | ICD-10-CM

## 2020-09-27 DIAGNOSIS — I10 Essential (primary) hypertension: Secondary | ICD-10-CM

## 2020-09-27 DIAGNOSIS — Z0289 Encounter for other administrative examinations: Principal | ICD-10-CM

## 2020-09-27 DIAGNOSIS — M1990 Unspecified osteoarthritis, unspecified site: Secondary | ICD-10-CM

## 2020-09-27 DIAGNOSIS — G709 Myoneural disorder, unspecified: Secondary | ICD-10-CM

## 2020-09-27 DIAGNOSIS — M170 Bilateral primary osteoarthritis of knee: Secondary | ICD-10-CM

## 2020-09-27 DIAGNOSIS — J45909 Unspecified asthma, uncomplicated: Secondary | ICD-10-CM

## 2020-09-27 DIAGNOSIS — M5030 Other cervical disc degeneration, unspecified cervical region: Secondary | ICD-10-CM

## 2020-09-27 DIAGNOSIS — M62838 Other muscle spasm: Secondary | ICD-10-CM

## 2020-09-27 MED ORDER — DULOXETINE HCL 30 MG PO CPEP
30 mg | Freq: Every day | ORAL | 1 refills | Status: CP
Start: 2020-09-27 — End: ?

## 2020-09-27 MED ORDER — BISOPROLOL-HYDROCHLOROTHIAZIDE 10-6.25 MG PO TABS
1 | ORAL_TABLET | Freq: Every day | ORAL | 0 refills | Status: CP
Start: 2020-09-27 — End: ?

## 2020-09-27 MED ORDER — TIZANIDINE HCL 4 MG PO TABS
4 mg | Freq: Three times a day (TID) | ORAL | 2 refills | Status: CP | PRN
Start: 2020-09-27 — End: ?

## 2020-09-30 ENCOUNTER — Inpatient Hospital Stay: Admit: 2020-09-30 | Discharge: 2020-10-01

## 2020-09-30 ENCOUNTER — Ambulatory Visit: Admit: 2020-09-30 | Discharge: 2020-10-01

## 2020-09-30 DIAGNOSIS — M1990 Unspecified osteoarthritis, unspecified site: Secondary | ICD-10-CM

## 2020-09-30 DIAGNOSIS — G8929 Other chronic pain: Secondary | ICD-10-CM

## 2020-09-30 DIAGNOSIS — D649 Anemia, unspecified: Secondary | ICD-10-CM

## 2020-09-30 DIAGNOSIS — M170 Bilateral primary osteoarthritis of knee: Secondary | ICD-10-CM

## 2020-09-30 DIAGNOSIS — M545 Chronic bilateral low back pain without sciatica: Secondary | ICD-10-CM

## 2020-09-30 DIAGNOSIS — I10 Essential (primary) hypertension: Principal | ICD-10-CM

## 2020-09-30 DIAGNOSIS — M62838 Other muscle spasm: Secondary | ICD-10-CM

## 2020-09-30 DIAGNOSIS — M5030 Other cervical disc degeneration, unspecified cervical region: Principal | ICD-10-CM

## 2020-09-30 DIAGNOSIS — J45909 Unspecified asthma, uncomplicated: Secondary | ICD-10-CM

## 2020-09-30 DIAGNOSIS — R768 Other specified abnormal immunological findings in serum: Principal | ICD-10-CM

## 2020-10-02 ENCOUNTER — Ambulatory Visit: Attending: Family

## 2020-10-02 DIAGNOSIS — J45909 Unspecified asthma, uncomplicated: Secondary | ICD-10-CM

## 2020-10-02 DIAGNOSIS — J4520 Mild intermittent asthma, uncomplicated: Secondary | ICD-10-CM

## 2020-10-02 DIAGNOSIS — D649 Anemia, unspecified: Secondary | ICD-10-CM

## 2020-10-02 DIAGNOSIS — Z6838 Body mass index (BMI) 38.0-38.9, adult: Secondary | ICD-10-CM

## 2020-10-02 DIAGNOSIS — M1990 Unspecified osteoarthritis, unspecified site: Secondary | ICD-10-CM

## 2020-10-02 DIAGNOSIS — I10 Essential (primary) hypertension: Principal | ICD-10-CM

## 2020-10-02 DIAGNOSIS — M62838 Other muscle spasm: Secondary | ICD-10-CM

## 2020-10-02 DIAGNOSIS — Z0289 Encounter for other administrative examinations: Principal | ICD-10-CM

## 2020-10-02 MED ORDER — PROAIR HFA 108 (90 BASE) MCG/ACT IN AERS
2 | Freq: Four times a day (QID) | RESPIRATORY_TRACT | 2 refills | 25.00000 days | Status: CP | PRN
Start: 2020-10-02 — End: ?

## 2020-10-19 DIAGNOSIS — M170 Bilateral primary osteoarthritis of knee: Principal | ICD-10-CM

## 2020-10-21 MED ORDER — DULOXETINE HCL 30 MG PO CPEP
1 refills | Status: CP
Start: 2020-10-21 — End: ?

## 2020-10-25 ENCOUNTER — Encounter: Attending: Medical

## 2020-11-01 ENCOUNTER — Ambulatory Visit: Attending: Medical

## 2020-11-01 DIAGNOSIS — D649 Anemia, unspecified: Secondary | ICD-10-CM

## 2020-11-01 DIAGNOSIS — Z1231 Encounter for screening mammogram for malignant neoplasm of breast: Principal | ICD-10-CM

## 2020-11-01 DIAGNOSIS — I10 Essential (primary) hypertension: Principal | ICD-10-CM

## 2020-11-01 DIAGNOSIS — Z1211 Encounter for screening for malignant neoplasm of colon: Secondary | ICD-10-CM

## 2020-11-01 DIAGNOSIS — M1990 Unspecified osteoarthritis, unspecified site: Secondary | ICD-10-CM

## 2020-11-01 DIAGNOSIS — J45909 Unspecified asthma, uncomplicated: Secondary | ICD-10-CM

## 2020-11-01 DIAGNOSIS — M62838 Other muscle spasm: Secondary | ICD-10-CM

## 2020-11-04 ENCOUNTER — Ambulatory Visit: Attending: Family

## 2020-11-04 DIAGNOSIS — M1990 Unspecified osteoarthritis, unspecified site: Secondary | ICD-10-CM

## 2020-11-04 DIAGNOSIS — J45909 Unspecified asthma, uncomplicated: Secondary | ICD-10-CM

## 2020-11-04 DIAGNOSIS — Z6838 Body mass index (BMI) 38.0-38.9, adult: Secondary | ICD-10-CM

## 2020-11-04 DIAGNOSIS — J4520 Mild intermittent asthma, uncomplicated: Secondary | ICD-10-CM

## 2020-11-04 DIAGNOSIS — I10 Essential (primary) hypertension: Principal | ICD-10-CM

## 2020-11-04 DIAGNOSIS — D649 Anemia, unspecified: Secondary | ICD-10-CM

## 2020-11-04 DIAGNOSIS — D229 Melanocytic nevi, unspecified: Principal | ICD-10-CM

## 2020-11-04 DIAGNOSIS — M62838 Other muscle spasm: Secondary | ICD-10-CM

## 2020-11-04 MED ORDER — PROAIR HFA 108 (90 BASE) MCG/ACT IN AERS
2 | Freq: Four times a day (QID) | RESPIRATORY_TRACT | 2 refills | 25.00000 days | Status: CP | PRN
Start: 2020-11-04 — End: ?

## 2020-11-05 ENCOUNTER — Ambulatory Visit: Attending: Dermatology

## 2020-11-05 DIAGNOSIS — I10 Essential (primary) hypertension: Principal | ICD-10-CM

## 2020-11-05 DIAGNOSIS — M1990 Unspecified osteoarthritis, unspecified site: Secondary | ICD-10-CM

## 2020-11-05 DIAGNOSIS — L720 Epidermal cyst: Principal | ICD-10-CM

## 2020-11-05 DIAGNOSIS — J45909 Unspecified asthma, uncomplicated: Secondary | ICD-10-CM

## 2020-11-05 DIAGNOSIS — M62838 Other muscle spasm: Secondary | ICD-10-CM

## 2020-11-05 DIAGNOSIS — D649 Anemia, unspecified: Secondary | ICD-10-CM

## 2020-11-10 MED ORDER — PREDNISONE 10 MG (21) PO TBPK
ORAL | 0 refills | 6.00000 days | Status: CP
Start: 2020-11-10 — End: ?

## 2020-11-15 ENCOUNTER — Encounter: Attending: Medical

## 2020-11-15 ENCOUNTER — Inpatient Hospital Stay: Admit: 2020-11-15 | Discharge: 2020-11-16

## 2020-11-15 DIAGNOSIS — M62838 Other muscle spasm: Secondary | ICD-10-CM

## 2020-11-15 DIAGNOSIS — J45909 Unspecified asthma, uncomplicated: Secondary | ICD-10-CM

## 2020-11-15 DIAGNOSIS — Z1231 Encounter for screening mammogram for malignant neoplasm of breast: Principal | ICD-10-CM

## 2020-11-15 DIAGNOSIS — M1990 Unspecified osteoarthritis, unspecified site: Secondary | ICD-10-CM

## 2020-11-15 DIAGNOSIS — D649 Anemia, unspecified: Secondary | ICD-10-CM

## 2020-11-15 DIAGNOSIS — I10 Essential (primary) hypertension: Principal | ICD-10-CM

## 2020-11-25 ENCOUNTER — Encounter

## 2020-11-28 ENCOUNTER — Ambulatory Visit: Attending: Dermatology

## 2020-11-28 DIAGNOSIS — H02826 Cysts of left eye, unspecified eyelid: Secondary | ICD-10-CM

## 2020-11-28 DIAGNOSIS — M1990 Unspecified osteoarthritis, unspecified site: Secondary | ICD-10-CM

## 2020-11-28 DIAGNOSIS — J45909 Unspecified asthma, uncomplicated: Secondary | ICD-10-CM

## 2020-11-28 DIAGNOSIS — I10 Essential (primary) hypertension: Principal | ICD-10-CM

## 2020-11-28 DIAGNOSIS — D649 Anemia, unspecified: Secondary | ICD-10-CM

## 2020-11-28 DIAGNOSIS — M62838 Other muscle spasm: Secondary | ICD-10-CM

## 2020-11-28 DIAGNOSIS — L720 Epidermal cyst: Principal | ICD-10-CM

## 2020-11-29 ENCOUNTER — Ambulatory Visit: Attending: Medical

## 2020-11-29 DIAGNOSIS — D649 Anemia, unspecified: Secondary | ICD-10-CM

## 2020-11-29 DIAGNOSIS — I10 Essential (primary) hypertension: Principal | ICD-10-CM

## 2020-11-29 DIAGNOSIS — J45909 Unspecified asthma, uncomplicated: Secondary | ICD-10-CM

## 2020-11-29 DIAGNOSIS — M62838 Other muscle spasm: Secondary | ICD-10-CM

## 2020-11-29 DIAGNOSIS — Z6838 Body mass index (BMI) 38.0-38.9, adult: Principal | ICD-10-CM

## 2020-11-29 DIAGNOSIS — M1990 Unspecified osteoarthritis, unspecified site: Secondary | ICD-10-CM

## 2020-12-16 ENCOUNTER — Encounter: Attending: Dermatology

## 2021-06-01 ENCOUNTER — Emergency Department: Admit: 2021-06-01 | Discharge: 2021-06-02 | Primary: Family

## 2021-06-01 ENCOUNTER — Inpatient Hospital Stay: Admit: 2021-06-01 | Discharge: 2021-06-02

## 2021-06-01 DIAGNOSIS — J45909 Unspecified asthma, uncomplicated: Secondary | ICD-10-CM

## 2021-06-01 DIAGNOSIS — D649 Anemia, unspecified: Secondary | ICD-10-CM

## 2021-06-01 DIAGNOSIS — M62838 Other muscle spasm: Secondary | ICD-10-CM

## 2021-06-01 DIAGNOSIS — I10 Essential (primary) hypertension: Principal | ICD-10-CM

## 2021-06-01 DIAGNOSIS — M1990 Unspecified osteoarthritis, unspecified site: Secondary | ICD-10-CM

## 2021-06-01 DIAGNOSIS — S39012A Strain of muscle, fascia and tendon of lower back, initial encounter: Principal | ICD-10-CM

## 2021-06-01 MED ORDER — TIZANIDINE HCL 4 MG PO TABS
4 mg | Freq: Three times a day (TID) | ORAL | 0 refills | Status: CP | PRN
Start: 2021-06-01 — End: ?

## 2021-06-01 MED ORDER — DIAZEPAM 5 MG PO TABS
5 mg | Freq: Once | ORAL | Status: CP
Start: 2021-06-01 — End: ?

## 2021-06-04 ENCOUNTER — Ambulatory Visit: Payer: Medicaid Other | Attending: Family | Primary: Family

## 2021-06-04 DIAGNOSIS — I10 Essential (primary) hypertension: Secondary | ICD-10-CM

## 2021-06-04 DIAGNOSIS — Z09 Encounter for follow-up examination after completed treatment for conditions other than malignant neoplasm: Principal | ICD-10-CM

## 2021-06-04 DIAGNOSIS — J4520 Mild intermittent asthma, uncomplicated: Secondary | ICD-10-CM

## 2021-06-04 DIAGNOSIS — M62838 Other muscle spasm: Secondary | ICD-10-CM

## 2021-06-04 DIAGNOSIS — M1990 Unspecified osteoarthritis, unspecified site: Secondary | ICD-10-CM

## 2021-06-04 DIAGNOSIS — D649 Anemia, unspecified: Secondary | ICD-10-CM

## 2021-06-04 DIAGNOSIS — Z6839 Body mass index (BMI) 39.0-39.9, adult: Secondary | ICD-10-CM

## 2021-06-04 DIAGNOSIS — J45909 Unspecified asthma, uncomplicated: Secondary | ICD-10-CM

## 2021-06-04 DIAGNOSIS — M170 Bilateral primary osteoarthritis of knee: Secondary | ICD-10-CM

## 2021-06-04 DIAGNOSIS — R0789 Other chest pain: Secondary | ICD-10-CM

## 2021-06-04 DIAGNOSIS — M62830 Muscle spasm of back: Secondary | ICD-10-CM

## 2021-06-04 MED ORDER — BISOPROLOL-HYDROCHLOROTHIAZIDE 10-6.25 MG PO TABS
1 | ORAL_TABLET | Freq: Every day | ORAL | 1 refills | Status: CP
Start: 2021-06-04 — End: ?

## 2021-06-04 MED ORDER — PROAIR HFA 108 (90 BASE) MCG/ACT IN AERS
2 | Freq: Four times a day (QID) | RESPIRATORY_TRACT | 2 refills | Status: CN | PRN
Start: 2021-06-04 — End: ?

## 2021-06-04 MED ORDER — AMLODIPINE BESYLATE 10 MG PO TABS
10 mg | Freq: Every day | ORAL | 1 refills | Status: CP
Start: 2021-06-04 — End: ?

## 2021-06-04 MED ORDER — DULOXETINE HCL 30 MG PO CPEP
30 mg | Freq: Every day | ORAL | 1 refills | Status: CP
Start: 2021-06-04 — End: ?

## 2021-06-04 MED ORDER — VENTOLIN HFA 108 (90 BASE) MCG/ACT IN AERS
2 | Freq: Four times a day (QID) | RESPIRATORY_TRACT | 3 refills | 25.00000 days | Status: CP | PRN
Start: 2021-06-04 — End: ?

## 2021-06-04 MED ORDER — TRAMADOL HCL 50 MG PO TABS
50 mg | Freq: Three times a day (TID) | ORAL | 0 refills | 13.50000 days | Status: CP | PRN
Start: 2021-06-04 — End: ?

## 2021-06-12 ENCOUNTER — Encounter: Attending: Medical | Primary: Family

## 2021-06-23 ENCOUNTER — Inpatient Hospital Stay: Admit: 2021-06-23 | Discharge: 2021-06-24 | Primary: Family

## 2021-06-23 DIAGNOSIS — R0789 Other chest pain: Principal | ICD-10-CM

## 2021-06-25 ENCOUNTER — Encounter: Attending: Medical | Primary: Family

## 2021-06-27 ENCOUNTER — Ambulatory Visit: Attending: Medical | Primary: Family

## 2021-06-27 DIAGNOSIS — Z6838 Body mass index (BMI) 38.0-38.9, adult: Principal | ICD-10-CM

## 2021-06-27 DIAGNOSIS — I10 Essential (primary) hypertension: Principal | ICD-10-CM

## 2021-06-27 DIAGNOSIS — M1711 Unilateral primary osteoarthritis, right knee: Secondary | ICD-10-CM

## 2021-06-27 DIAGNOSIS — M47816 Spondylosis without myelopathy or radiculopathy, lumbar region: Secondary | ICD-10-CM

## 2021-06-27 DIAGNOSIS — M1990 Unspecified osteoarthritis, unspecified site: Secondary | ICD-10-CM

## 2021-06-27 DIAGNOSIS — M62838 Other muscle spasm: Secondary | ICD-10-CM

## 2021-06-27 DIAGNOSIS — J45909 Unspecified asthma, uncomplicated: Secondary | ICD-10-CM

## 2021-06-27 DIAGNOSIS — D649 Anemia, unspecified: Secondary | ICD-10-CM

## 2021-06-27 DIAGNOSIS — M659 Synovitis and tenosynovitis, unspecified: Secondary | ICD-10-CM

## 2021-06-27 DIAGNOSIS — G5603 Carpal tunnel syndrome, bilateral upper limbs: Secondary | ICD-10-CM

## 2021-06-27 MED ORDER — PREDNISONE 10 MG (21) PO TBPK
0 refills | 7.00 days | Status: CP
Start: 2021-06-27 — End: ?

## 2021-06-27 MED ORDER — GABAPENTIN 300 MG PO CAPS
300 mg | Freq: Three times a day (TID) | ORAL | 1 refills | Status: CP
Start: 2021-06-27 — End: ?

## 2021-09-03 ENCOUNTER — Ambulatory Visit: Payer: Medicaid Other | Attending: Family | Primary: Family

## 2021-09-03 DIAGNOSIS — I10 Essential (primary) hypertension: Principal | ICD-10-CM

## 2021-09-03 DIAGNOSIS — Z1231 Encounter for screening mammogram for malignant neoplasm of breast: Secondary | ICD-10-CM

## 2021-09-03 DIAGNOSIS — Z13220 Encounter for screening for lipoid disorders: Secondary | ICD-10-CM

## 2021-09-03 DIAGNOSIS — M1712 Unilateral primary osteoarthritis, left knee: Secondary | ICD-10-CM

## 2021-09-03 DIAGNOSIS — Z130 Encounter for screening for diseases of the blood and blood-forming organs and certain disorders involving the immune mechanism: Secondary | ICD-10-CM

## 2021-09-03 DIAGNOSIS — Z13228 Encounter for screening for other metabolic disorders: Secondary | ICD-10-CM

## 2021-09-03 DIAGNOSIS — Z1329 Encounter for screening for other suspected endocrine disorder: Secondary | ICD-10-CM

## 2021-09-03 DIAGNOSIS — M1711 Unilateral primary osteoarthritis, right knee: Secondary | ICD-10-CM

## 2021-09-03 DIAGNOSIS — M47816 Spondylosis without myelopathy or radiculopathy, lumbar region: Secondary | ICD-10-CM

## 2021-09-03 DIAGNOSIS — D649 Anemia, unspecified: Secondary | ICD-10-CM

## 2021-09-03 DIAGNOSIS — J45909 Unspecified asthma, uncomplicated: Secondary | ICD-10-CM

## 2021-09-03 DIAGNOSIS — Z6837 Body mass index (BMI) 37.0-37.9, adult: Secondary | ICD-10-CM

## 2021-09-03 DIAGNOSIS — M62838 Other muscle spasm: Secondary | ICD-10-CM

## 2021-09-03 DIAGNOSIS — Z0000 Encounter for general adult medical examination without abnormal findings: Principal | ICD-10-CM

## 2021-09-03 DIAGNOSIS — Z1211 Encounter for screening for malignant neoplasm of colon: Secondary | ICD-10-CM

## 2021-09-03 DIAGNOSIS — M1990 Unspecified osteoarthritis, unspecified site: Secondary | ICD-10-CM

## 2021-09-09 DIAGNOSIS — D501 Sideropenic dysphagia: Principal | ICD-10-CM

## 2021-09-09 DIAGNOSIS — D509 Iron deficiency anemia, unspecified: Secondary | ICD-10-CM

## 2021-09-09 MED ORDER — FERROUS SULFATE 325 (65 FE) MG PO TABS
325 mg | Freq: Every day | ORAL | 1 refills | Status: CP
Start: 2021-09-09 — End: ?

## 2021-09-18 ENCOUNTER — Inpatient Hospital Stay: Attending: Hematology & Oncology | Primary: Family

## 2021-09-22 ENCOUNTER — Ambulatory Visit: Admit: 2021-09-22 | Discharge: 2021-09-23 | Payer: Medicaid Other | Attending: Hematology & Oncology | Primary: Family

## 2021-09-22 DIAGNOSIS — J45909 Unspecified asthma, uncomplicated: Secondary | ICD-10-CM

## 2021-09-22 DIAGNOSIS — M1990 Unspecified osteoarthritis, unspecified site: Secondary | ICD-10-CM

## 2021-09-22 DIAGNOSIS — I10 Essential (primary) hypertension: Principal | ICD-10-CM

## 2021-09-22 DIAGNOSIS — M62838 Other muscle spasm: Secondary | ICD-10-CM

## 2021-09-22 DIAGNOSIS — D649 Anemia, unspecified: Secondary | ICD-10-CM

## 2021-09-22 DIAGNOSIS — D509 Iron deficiency anemia, unspecified: Principal | ICD-10-CM

## 2021-09-24 ENCOUNTER — Encounter: Primary: Family

## 2021-09-29 MED ORDER — DEXAMETHASONE SODIUM PHOSPHATE 20 MG/5ML IJ SOLN
20 mg | INTRAVENOUS | PRN
Start: 2021-09-29 — End: ?

## 2021-09-29 MED ORDER — SODIUM CHLORIDE FLUSH 0.9 % IV SOLN
10 mL | PRN
Start: 2021-09-29 — End: ?

## 2021-09-29 MED ORDER — HYDROCORTISONE SOD SUC (PF) 100 MG IJ SOLR
100 mg | INTRAVENOUS | PRN
Start: 2021-09-29 — End: ?

## 2021-09-29 MED ORDER — HEPARIN SOD (PORK) LOCK FLUSH 100 UNIT/ML IV SOLN CUSTOM SH
500 [IU] | PRN
Start: 2021-09-29 — End: ?

## 2021-09-29 MED ORDER — DIPHENHYDRAMINE HCL 50 MG/ML IJ SOLN
25 mg | INTRAVENOUS | PRN
Start: 2021-09-29 — End: ?

## 2021-09-29 MED ORDER — DIPHENHYDRAMINE IVPB JX
25 mg | Freq: Once | INTRAVENOUS
Start: 2021-09-29 — End: ?

## 2021-09-29 MED ORDER — IRON SUCROSE INFUSION JX 300 - 399 MG
300 mg | Freq: Once | INTRAVENOUS
Start: 2021-09-29 — End: ?

## 2021-09-29 MED ORDER — SODIUM CHLORIDE 0.9 % IV SOLN
250 mL | Freq: Once | INTRAVENOUS
Start: 2021-09-29 — End: ?

## 2021-09-29 MED ORDER — IRON SUCROSE INFUSION JX 400 - 499 MG
400 mg | Freq: Once | INTRAVENOUS
Start: 2021-09-29 — End: ?

## 2021-09-29 MED ORDER — MEPERIDINE HCL 25 MG/ML IJ SOLN
25 mg | INTRAVENOUS | PRN
Start: 2021-09-29 — End: ?

## 2021-10-01 ENCOUNTER — Inpatient Hospital Stay: Admit: 2021-10-01 | Discharge: 2021-10-02 | Payer: Medicaid Other | Primary: Family

## 2021-10-08 ENCOUNTER — Inpatient Hospital Stay: Attending: Hematology & Oncology | Primary: Family

## 2021-10-28 ENCOUNTER — Encounter: Admit: 2021-10-28 | Payer: Medicaid Other | Primary: Family

## 2021-10-28 ENCOUNTER — Inpatient Hospital Stay: Admit: 2021-10-28 | Discharge: 2021-10-29 | Payer: Medicaid Other | Primary: Family

## 2021-10-28 ENCOUNTER — Inpatient Hospital Stay: Admit: 2021-10-28 | Discharge: 2021-11-07 | Payer: Medicaid Other | Primary: Family

## 2021-10-30 ENCOUNTER — Inpatient Hospital Stay: Admit: 2021-10-30 | Discharge: 2021-10-31 | Payer: Medicaid Other | Primary: Family

## 2021-10-30 DIAGNOSIS — D509 Iron deficiency anemia, unspecified: Principal | ICD-10-CM

## 2021-10-30 MED ORDER — IRON SUCROSE INFUSION JX 300 - 399 MG
300 mg | Freq: Once | INTRAVENOUS | Status: CP
Start: 2021-10-30 — End: ?

## 2021-10-30 MED ORDER — IRON SUCROSE INFUSION JX 400 - 499 MG
400 mg | Freq: Once | INTRAVENOUS
Start: 2021-10-30 — End: ?

## 2021-10-30 MED ORDER — DIPHENHYDRAMINE IVPB JX
25 mg | Freq: Once | INTRAVENOUS | Status: CP
Start: 2021-10-30 — End: ?

## 2021-10-30 MED ORDER — SODIUM CHLORIDE 0.9 % IV SOLN
250 mL | Freq: Once | INTRAVENOUS
Start: 2021-10-30 — End: ?

## 2021-10-30 MED ORDER — HYDROCORTISONE SOD SUC (PF) 100 MG IJ SOLR
100 mg | INTRAVENOUS | PRN
Start: 2021-10-30 — End: ?

## 2021-10-30 MED ORDER — HEPARIN SOD (PORK) LOCK FLUSH 100 UNIT/ML IV SOLN CUSTOM SH
500 [IU] | PRN
Start: 2021-10-30 — End: ?

## 2021-10-30 MED ORDER — MEPERIDINE HCL 25 MG/ML IJ SOLN
25 mg | INTRAVENOUS | PRN
Start: 2021-10-30 — End: ?

## 2021-10-30 MED ORDER — SODIUM CHLORIDE FLUSH 0.9 % IV SOLN
10 mL | PRN
Start: 2021-10-30 — End: ?

## 2021-10-30 MED ORDER — SODIUM CHLORIDE 0.9 % IV SOLN
250 mL | Freq: Once | INTRAVENOUS | Status: CP
Start: 2021-10-30 — End: ?

## 2021-10-30 MED ORDER — DEXAMETHASONE SODIUM PHOSPHATE 20 MG/5ML IJ SOLN
20 mg | INTRAVENOUS | PRN
Start: 2021-10-30 — End: ?

## 2021-10-30 MED ORDER — DIPHENHYDRAMINE IVPB JX
25 mg | Freq: Once | INTRAVENOUS
Start: 2021-10-30 — End: ?

## 2021-10-30 MED ORDER — DIPHENHYDRAMINE HCL 50 MG/ML IJ SOLN
25 mg | INTRAVENOUS | PRN
Start: 2021-10-30 — End: ?

## 2021-10-30 MED ORDER — IRON SUCROSE INFUSION JX 300 - 399 MG
300 mg | Freq: Once | INTRAVENOUS
Start: 2021-10-30 — End: ?

## 2021-11-05 ENCOUNTER — Inpatient Hospital Stay: Admit: 2021-11-05 | Discharge: 2021-11-06 | Payer: Medicaid Other | Primary: Family

## 2021-11-05 DIAGNOSIS — D509 Iron deficiency anemia, unspecified: Principal | ICD-10-CM

## 2021-11-05 MED ORDER — DIPHENHYDRAMINE IVPB JX
25 mg | Freq: Once | INTRAVENOUS | Status: CP
Start: 2021-11-05 — End: ?

## 2021-11-05 MED ORDER — IRON SUCROSE INFUSION JX 300 - 399 MG
300 mg | Freq: Once | INTRAVENOUS | Status: CP
Start: 2021-11-05 — End: ?

## 2021-11-05 MED ORDER — DIPHENHYDRAMINE HCL 50 MG/ML IJ SOLN
25 mg | INTRAVENOUS | PRN
Start: 2021-11-05 — End: ?

## 2021-11-05 MED ORDER — IRON SUCROSE INFUSION JX 300 - 399 MG
300 mg | Freq: Once | INTRAVENOUS | Status: CN
Start: 2021-11-05 — End: ?

## 2021-11-05 MED ORDER — DIPHENHYDRAMINE IVPB JX
25 mg | Freq: Once | INTRAVENOUS
Start: 2021-11-05 — End: ?

## 2021-11-05 MED ORDER — SODIUM CHLORIDE FLUSH 0.9 % IV SOLN
10 mL | PRN
Start: 2021-11-05 — End: ?

## 2021-11-05 MED ORDER — HEPARIN SOD (PORK) LOCK FLUSH 100 UNIT/ML IV SOLN CUSTOM SH
500 [IU] | PRN
Start: 2021-11-05 — End: ?

## 2021-11-05 MED ORDER — SODIUM CHLORIDE 0.9 % IV SOLN
250 mL | Freq: Once | INTRAVENOUS
Start: 2021-11-05 — End: ?

## 2021-11-05 MED ORDER — HYDROCORTISONE SOD SUC (PF) 100 MG IJ SOLR
100 mg | INTRAVENOUS | PRN
Start: 2021-11-05 — End: ?

## 2021-11-05 MED ORDER — DEXAMETHASONE SODIUM PHOSPHATE 20 MG/5ML IJ SOLN
20 mg | INTRAVENOUS | PRN
Start: 2021-11-05 — End: ?

## 2021-11-05 MED ORDER — MEPERIDINE HCL 25 MG/ML IJ SOLN
25 mg | INTRAVENOUS | PRN
Start: 2021-11-05 — End: ?

## 2021-11-05 MED ORDER — IRON SUCROSE INFUSION JX 400 - 499 MG
400 mg | Freq: Once | INTRAVENOUS
Start: 2021-11-05 — End: ?

## 2021-11-05 MED ORDER — SODIUM CHLORIDE 0.9 % IV SOLN
250 mL | Freq: Once | INTRAVENOUS | Status: CP
Start: 2021-11-05 — End: ?

## 2021-11-10 ENCOUNTER — Inpatient Hospital Stay: Admit: 2021-11-10 | Discharge: 2021-11-11 | Payer: Medicaid Other | Primary: Family

## 2021-11-10 DIAGNOSIS — D509 Iron deficiency anemia, unspecified: Principal | ICD-10-CM

## 2021-11-10 MED ORDER — IRON SUCROSE INFUSION JX 400 - 499 MG
400 mg | Freq: Once | INTRAVENOUS | Status: CN
Start: 2021-11-10 — End: ?

## 2021-11-10 MED ORDER — DIPHENHYDRAMINE HCL 50 MG/ML IJ SOLN
25 mg | INTRAVENOUS | PRN
Start: 2021-11-10 — End: ?

## 2021-11-10 MED ORDER — HEPARIN SOD (PORK) LOCK FLUSH 100 UNIT/ML IV SOLN CUSTOM SH
500 [IU] | PRN
Start: 2021-11-10 — End: ?

## 2021-11-10 MED ORDER — DIPHENHYDRAMINE IVPB JX
25 mg | Freq: Once | INTRAVENOUS | Status: CN
Start: 2021-11-10 — End: ?

## 2021-11-10 MED ORDER — SODIUM CHLORIDE 0.9 % IV SOLN
250 mL | Freq: Once | INTRAVENOUS | Status: CN
Start: 2021-11-10 — End: ?

## 2021-11-10 MED ORDER — IRON SUCROSE INFUSION JX 400 - 499 MG
400 mg | Freq: Once | INTRAVENOUS | Status: CP
Start: 2021-11-10 — End: ?

## 2021-11-10 MED ORDER — HYDROCORTISONE SOD SUC (PF) 100 MG IJ SOLR
100 mg | INTRAVENOUS | PRN
Start: 2021-11-10 — End: ?

## 2021-11-10 MED ORDER — SODIUM CHLORIDE 0.9 % IV SOLN
250 mL | Freq: Once | INTRAVENOUS | Status: CP
Start: 2021-11-10 — End: ?

## 2021-11-10 MED ORDER — DEXAMETHASONE SODIUM PHOSPHATE 20 MG/5ML IJ SOLN
20 mg | INTRAVENOUS | PRN
Start: 2021-11-10 — End: ?

## 2021-11-10 MED ORDER — SODIUM CHLORIDE FLUSH 0.9 % IV SOLN
10 mL | PRN
Start: 2021-11-10 — End: ?

## 2021-11-10 MED ORDER — MEPERIDINE HCL 25 MG/ML IJ SOLN
25 mg | INTRAVENOUS | PRN
Start: 2021-11-10 — End: ?

## 2021-11-10 MED ORDER — DIPHENHYDRAMINE IVPB JX
25 mg | Freq: Once | INTRAVENOUS | Status: CP
Start: 2021-11-10 — End: ?

## 2021-11-11 ENCOUNTER — Encounter: Admit: 2021-11-11 | Payer: Medicaid Other | Primary: Family

## 2021-11-18 ENCOUNTER — Encounter: Admit: 2021-11-18 | Payer: Medicaid Other | Primary: Family

## 2021-11-21 ENCOUNTER — Encounter: Primary: Family

## 2021-11-28 DIAGNOSIS — D649 Anemia, unspecified: Secondary | ICD-10-CM

## 2021-11-28 DIAGNOSIS — M62838 Other muscle spasm: Secondary | ICD-10-CM

## 2021-11-28 DIAGNOSIS — I10 Essential (primary) hypertension: Principal | ICD-10-CM

## 2021-11-28 DIAGNOSIS — J45909 Unspecified asthma, uncomplicated: Secondary | ICD-10-CM

## 2021-11-28 DIAGNOSIS — M1990 Unspecified osteoarthritis, unspecified site: Secondary | ICD-10-CM

## 2021-11-28 DIAGNOSIS — K439 Ventral hernia without obstruction or gangrene: Principal | ICD-10-CM

## 2021-11-28 MED ORDER — IOHEXOL 350 MG/ML IV SOLN SH
100 mL | Freq: Once | INTRAVENOUS | Status: CP
Start: 2021-11-28 — End: ?

## 2021-11-29 ENCOUNTER — Emergency Department: Admit: 2021-11-29 | Discharge: 2021-11-29 | Payer: Medicaid Other

## 2021-11-29 ENCOUNTER — Inpatient Hospital Stay: Admit: 2021-11-29 | Discharge: 2021-11-29 | Payer: Medicaid Other

## 2021-12-09 IMAGING — MG DIGITAL SCREENING BREAST BILAT IMPLANT W/ TOMO W/ CAD
8 of 12 series · 8 of 28 positions shown · non-contrast
Comparison: None.

CLINICAL DATA: Screening.

EXAM:
DIGITAL SCREENING BILATERAL MAMMOGRAM WITH IMPLANTS, CAD AND
TOMOSYNTHESIS
TECHNIQUE: Bilateral screening digital craniocaudal and mediolateral oblique
mammograms were obtained. Bilateral screening digital breast
tomosynthesis was performed. The images were evaluated with
computer-aided detection. Standard and/or implant displaced views
were performed.

[R CC]
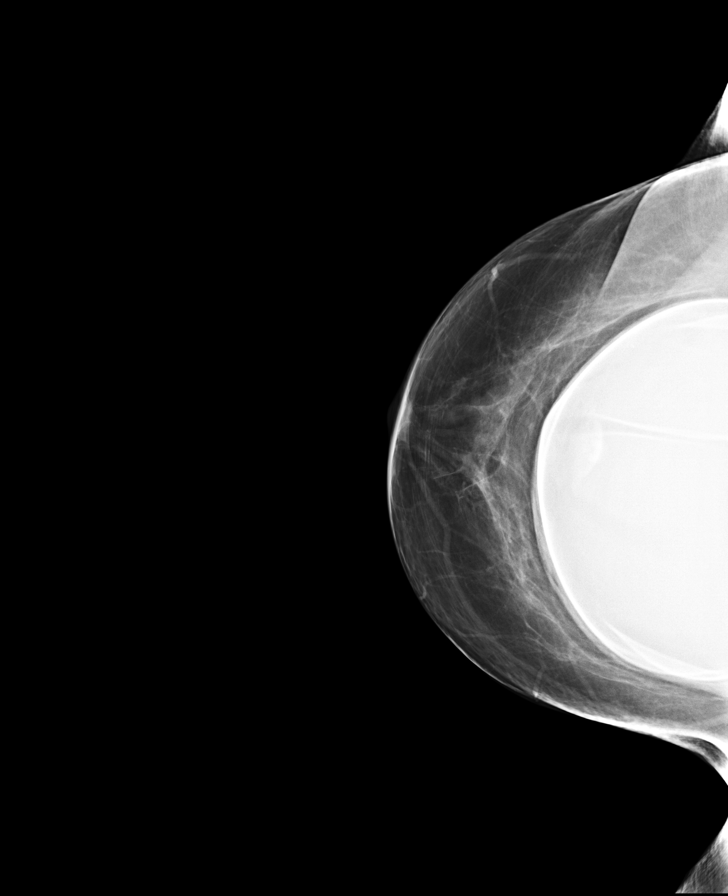

[L MLO]
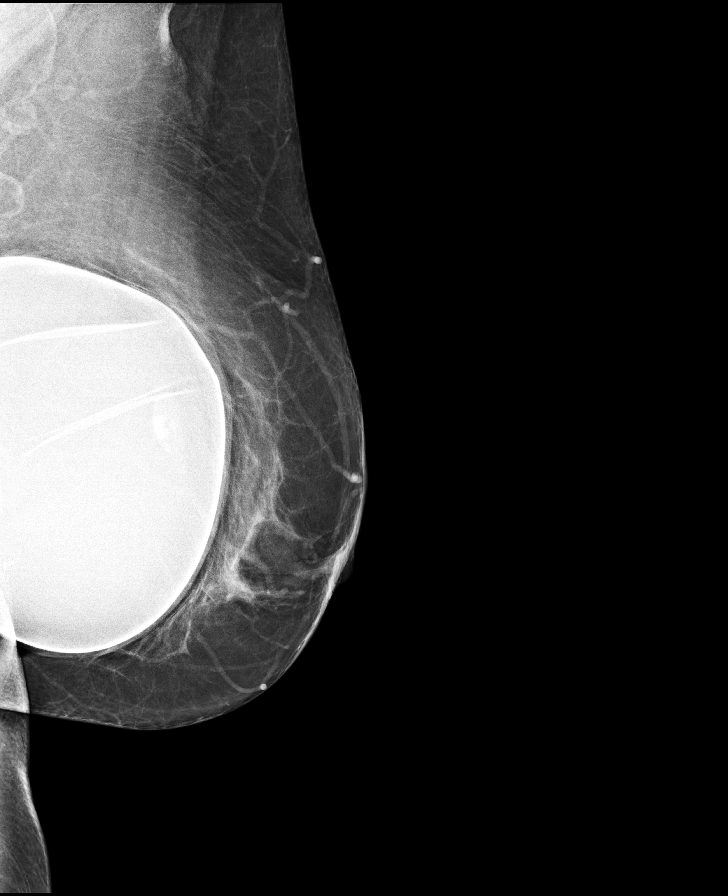

[R MLO]
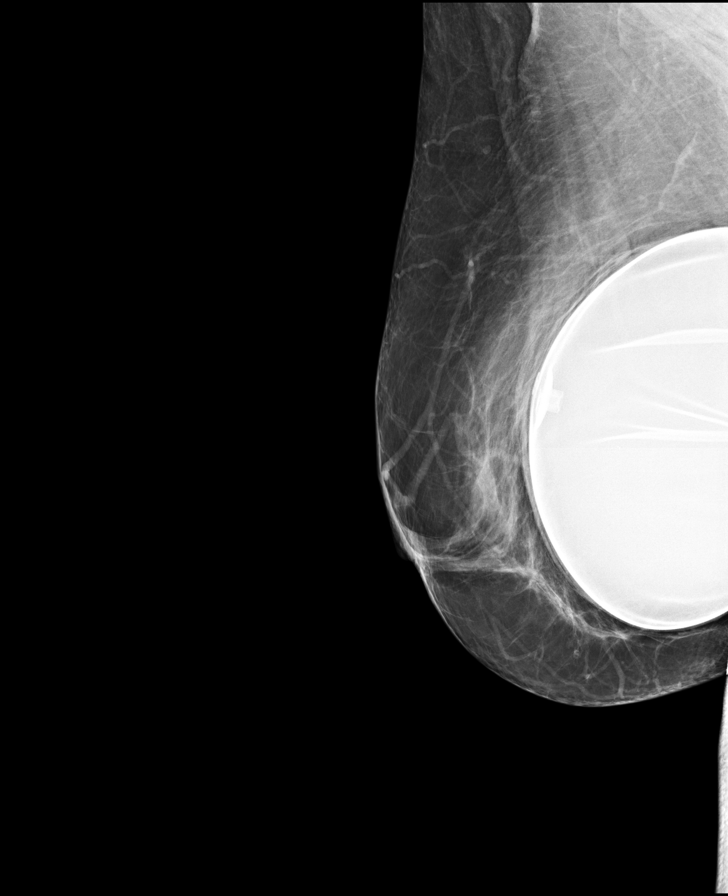

[L CC]
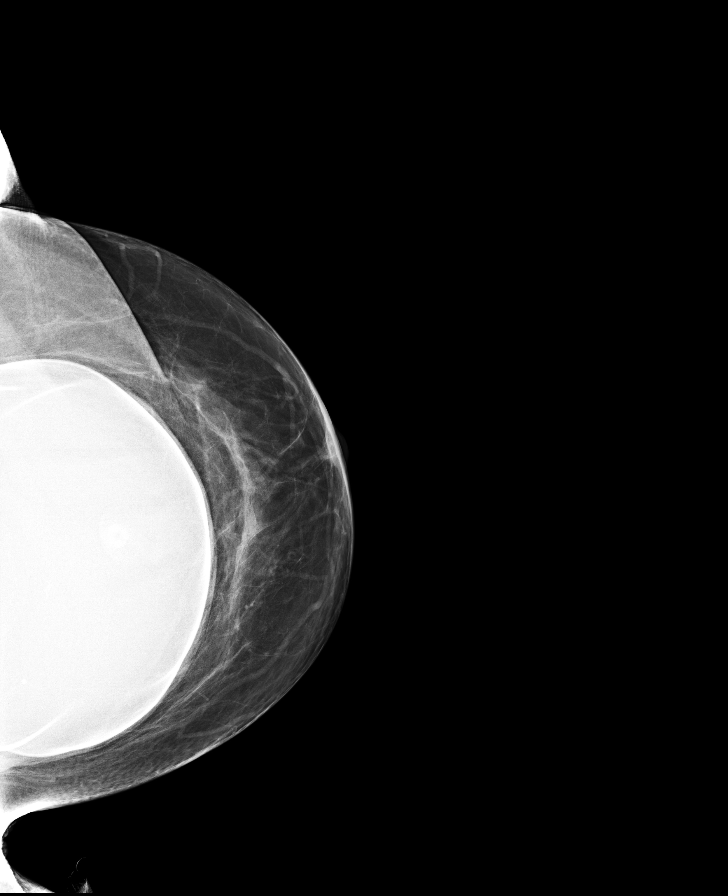

[R MLO synth-2D]
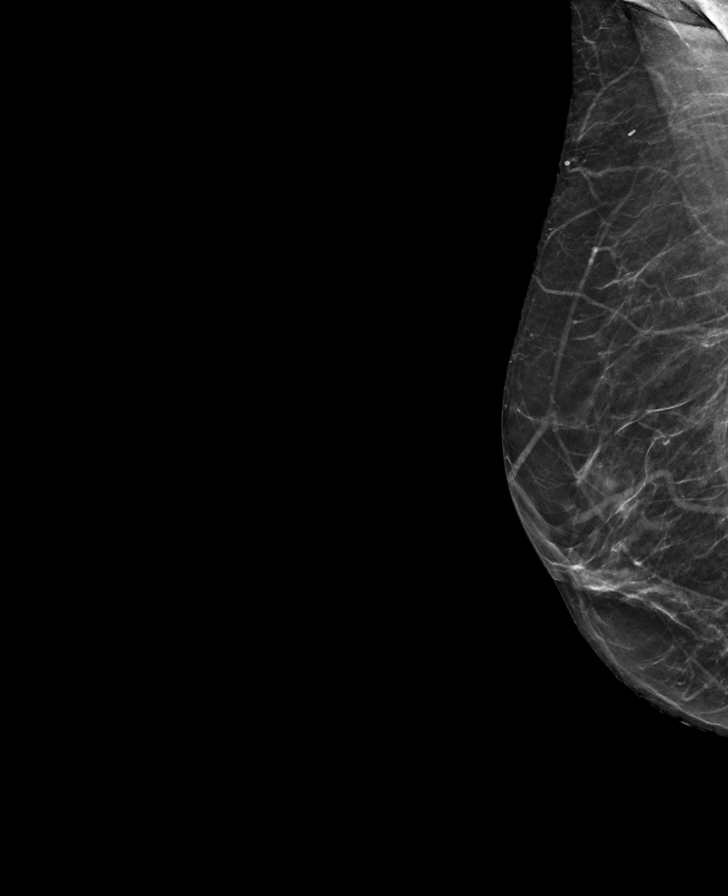

[R CC synth-2D]
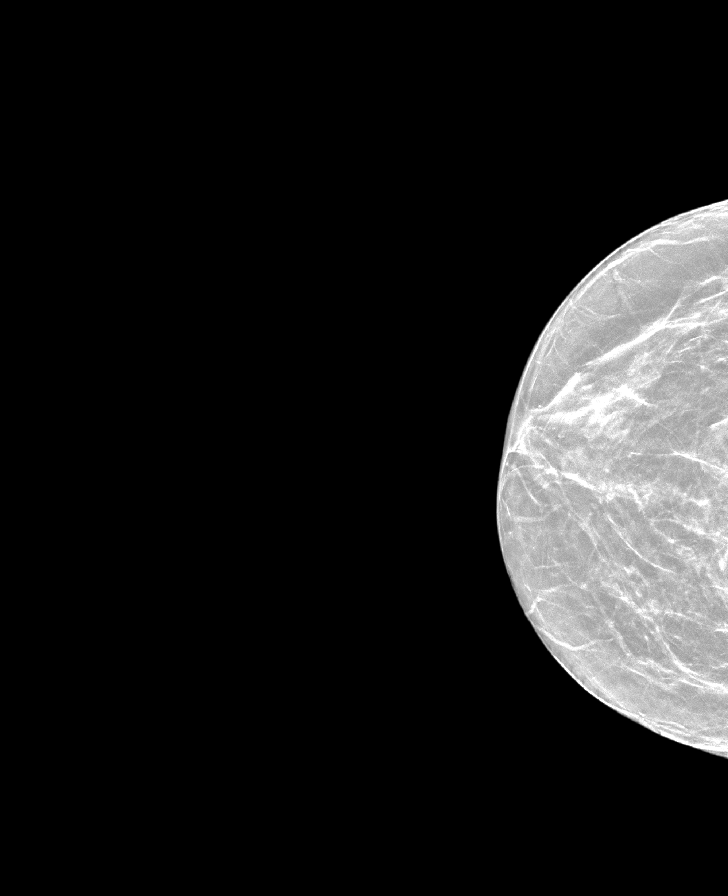

[L CC synth-2D]
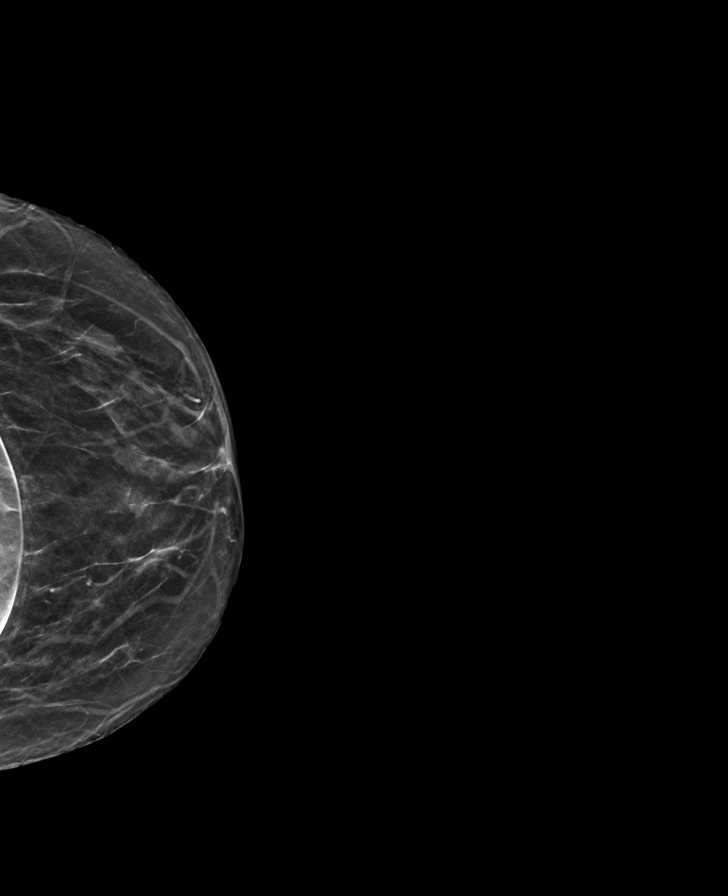

[L MLO synth-2D]
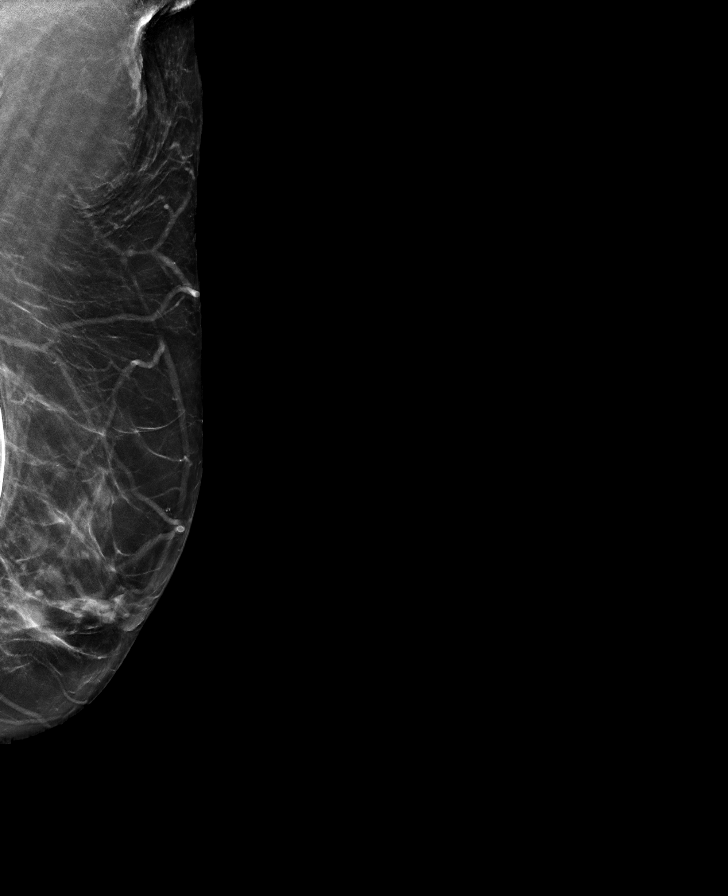

[8 of 28 positions shown; findings below may reference images not displayed]

ACR Breast Density Category b: There are scattered areas of
fibroglandular density.
FINDINGS: The patient has retropectoral implants. There are no findings
suspicious for malignancy.
IMPRESSION: No mammographic evidence of malignancy. A result letter of this
screening mammogram will be mailed directly to the patient.

RECOMMENDATION:
Screening mammogram in one year. (Code:32-U-UKL)

BI-RADS CATEGORY  1:  Negative.

## 2021-12-26 ENCOUNTER — Inpatient Hospital Stay: Admit: 2021-12-26 | Discharge: 2021-12-27 | Payer: Medicaid Other | Attending: Neurology | Primary: Family

## 2021-12-26 DIAGNOSIS — G5603 Carpal tunnel syndrome, bilateral upper limbs: Principal | ICD-10-CM

## 2022-02-14 DIAGNOSIS — J029 Acute pharyngitis, unspecified: Principal | ICD-10-CM

## 2022-02-15 ENCOUNTER — Inpatient Hospital Stay: Payer: Medicaid Other

## 2022-06-18 DIAGNOSIS — J4520 Mild intermittent asthma, uncomplicated: Principal | ICD-10-CM

## 2022-06-18 DIAGNOSIS — I10 Essential (primary) hypertension: Secondary | ICD-10-CM

## 2022-06-18 DIAGNOSIS — M170 Bilateral primary osteoarthritis of knee: Secondary | ICD-10-CM

## 2022-06-19 MED ORDER — AMLODIPINE BESYLATE 10 MG PO TABS
10 mg | Freq: Every day | ORAL | 5 refills
Start: 2022-06-19 — End: ?

## 2022-06-19 MED ORDER — VENTOLIN HFA 108 (90 BASE) MCG/ACT IN AERS
2 | Freq: Four times a day (QID) | RESPIRATORY_TRACT | 3 refills | PRN
Start: 2022-06-19 — End: ?

## 2022-06-19 MED ORDER — DULOXETINE HCL 30 MG PO CPEP
30 mg | Freq: Every day | ORAL | 5 refills
Start: 2022-06-19 — End: ?

## 2022-06-19 MED ORDER — BISOPROLOL-HYDROCHLOROTHIAZIDE 10-6.25 MG PO TABS
1 | ORAL_TABLET | Freq: Every day | ORAL | 1 refills
Start: 2022-06-19 — End: ?

## 2022-07-06 ENCOUNTER — Emergency Department: Admit: 2022-07-06 | Discharge: 2022-07-06 | Payer: Medicaid Other

## 2022-07-06 ENCOUNTER — Inpatient Hospital Stay: Admit: 2022-07-06 | Discharge: 2022-07-06

## 2022-07-06 DIAGNOSIS — M546 Pain in thoracic spine: Secondary | ICD-10-CM

## 2022-07-06 DIAGNOSIS — S22000A Wedge compression fracture of unspecified thoracic vertebra, initial encounter for closed fracture: Secondary | ICD-10-CM

## 2022-07-06 DIAGNOSIS — M549 Dorsalgia, unspecified: Principal | ICD-10-CM

## 2022-07-06 MED ORDER — LIDOCAINE PATCH REMOVAL
1 | MEDICATED_PATCH | Freq: Every evening | TRANSDERMAL | Status: DC
Start: 2022-07-06 — End: 2022-07-07

## 2022-07-06 MED ORDER — METHOCARBAMOL 500 MG PO TABS
500 mg | Freq: Once | ORAL | Status: CP
Start: 2022-07-06 — End: ?

## 2022-07-06 MED ORDER — LIDOCAINE 5 % EX PTCH
1 | MEDICATED_PATCH | TRANSDERMAL | 0 refills | Status: CP
Start: 2022-07-06 — End: ?

## 2022-07-06 MED ORDER — LIDOCAINE 5 % EX PTCH
1 | MEDICATED_PATCH | TRANSDERMAL | Status: DC
Start: 2022-07-06 — End: 2022-07-07

## 2022-07-06 MED ORDER — CYCLOBENZAPRINE HCL 5 MG PO TABS
5 mg | Freq: Three times a day (TID) | ORAL | 0 refills | Status: CP | PRN
Start: 2022-07-06 — End: ?

## 2022-07-07 ENCOUNTER — Inpatient Hospital Stay

## 2022-07-07 DIAGNOSIS — J45909 Unspecified asthma, uncomplicated: Secondary | ICD-10-CM

## 2022-07-07 DIAGNOSIS — I10 Essential (primary) hypertension: Principal | ICD-10-CM

## 2022-07-07 DIAGNOSIS — M62838 Other muscle spasm: Secondary | ICD-10-CM

## 2022-07-07 DIAGNOSIS — M1990 Unspecified osteoarthritis, unspecified site: Secondary | ICD-10-CM

## 2022-07-07 DIAGNOSIS — M62830 Muscle spasm of back: Principal | ICD-10-CM

## 2022-07-07 DIAGNOSIS — D649 Anemia, unspecified: Secondary | ICD-10-CM

## 2022-07-07 MED ORDER — DEXAMETHASONE SODIUM PHOSPHATE 4 MG/ML IJ SOLN
4 mg | Freq: Once | INTRAMUSCULAR | Status: CP
Start: 2022-07-07 — End: ?

## 2022-07-07 MED ORDER — METHOCARBAMOL 500 MG PO TABS
500 mg | Freq: Once | ORAL | Status: CP
Start: 2022-07-07 — End: ?

## 2022-07-07 MED ORDER — TIZANIDINE HCL 4 MG PO TABS
4 mg | Freq: Three times a day (TID) | ORAL | 0 refills | Status: CP | PRN
Start: 2022-07-07 — End: ?

## 2022-07-11 DIAGNOSIS — R21 Rash and other nonspecific skin eruption: Principal | ICD-10-CM

## 2022-07-11 DIAGNOSIS — M62838 Other muscle spasm: Secondary | ICD-10-CM

## 2022-07-11 DIAGNOSIS — D649 Anemia, unspecified: Secondary | ICD-10-CM

## 2022-07-11 DIAGNOSIS — M1990 Unspecified osteoarthritis, unspecified site: Secondary | ICD-10-CM

## 2022-07-11 DIAGNOSIS — J45909 Unspecified asthma, uncomplicated: Secondary | ICD-10-CM

## 2022-07-11 DIAGNOSIS — I10 Essential (primary) hypertension: Principal | ICD-10-CM

## 2022-07-11 MED ORDER — VALACYCLOVIR HCL 1 G PO TABS
1000 mg | Freq: Three times a day (TID) | ORAL | 0 refills | Status: CP
Start: 2022-07-11 — End: ?

## 2022-07-12 ENCOUNTER — Inpatient Hospital Stay

## 2022-09-08 ENCOUNTER — Ambulatory Visit: Attending: Family

## 2022-09-08 DIAGNOSIS — Z0000 Encounter for general adult medical examination without abnormal findings: Secondary | ICD-10-CM

## 2022-09-08 DIAGNOSIS — I10 Essential (primary) hypertension: Principal | ICD-10-CM

## 2022-09-08 DIAGNOSIS — D649 Anemia, unspecified: Secondary | ICD-10-CM

## 2022-09-08 DIAGNOSIS — Z124 Encounter for screening for malignant neoplasm of cervix: Secondary | ICD-10-CM

## 2022-09-08 DIAGNOSIS — Z1321 Encounter for screening for nutritional disorder: Secondary | ICD-10-CM

## 2022-09-08 DIAGNOSIS — Z13228 Encounter for screening for other metabolic disorders: Secondary | ICD-10-CM

## 2022-09-08 DIAGNOSIS — M1990 Unspecified osteoarthritis, unspecified site: Secondary | ICD-10-CM

## 2022-09-08 DIAGNOSIS — Z23 Encounter for immunization: Secondary | ICD-10-CM

## 2022-09-08 DIAGNOSIS — Z1231 Encounter for screening mammogram for malignant neoplasm of breast: Secondary | ICD-10-CM

## 2022-09-08 DIAGNOSIS — Z1329 Encounter for screening for other suspected endocrine disorder: Secondary | ICD-10-CM

## 2022-09-08 DIAGNOSIS — J45909 Unspecified asthma, uncomplicated: Secondary | ICD-10-CM

## 2022-09-08 DIAGNOSIS — M62838 Other muscle spasm: Secondary | ICD-10-CM

## 2022-09-08 DIAGNOSIS — Z130 Encounter for screening for diseases of the blood and blood-forming organs and certain disorders involving the immune mechanism: Secondary | ICD-10-CM

## 2022-09-08 MED ORDER — AMLODIPINE BESYLATE 10 MG PO TABS
10 mg | Freq: Every day | ORAL | 1 refills | Status: CP
Start: 2022-09-08 — End: ?

## 2022-09-08 MED ORDER — BISOPROLOL-HYDROCHLOROTHIAZIDE 10-6.25 MG PO TABS
1 | ORAL_TABLET | Freq: Every day | ORAL | 1 refills | Status: CP
Start: 2022-09-08 — End: ?

## 2022-09-08 MED ORDER — ZOSTER VAC RECOMB ADJUVANTED 50 MCG/0.5ML IM SUSR
.5 mL | Freq: Once | INTRAMUSCULAR | 0 refills | Status: CP
Start: 2022-09-08 — End: ?

## 2022-09-10 DIAGNOSIS — E559 Vitamin D deficiency, unspecified: Principal | ICD-10-CM

## 2022-09-10 MED ORDER — ERGOCALCIFEROL 1.25 MG (50000 UT) PO CAPS
1.25 mg | ORAL | 2 refills | 28.00000 days | Status: CP
Start: 2022-09-10 — End: ?

## 2022-09-11 MED ORDER — BISOPROLOL-HYDROCHLOROTHIAZIDE 10-6.25 MG PO TABS
1 | ORAL_TABLET | Freq: Every day | ORAL | 1 refills
Start: 2022-09-11 — End: ?

## 2022-09-12 DIAGNOSIS — M62838 Other muscle spasm: Secondary | ICD-10-CM

## 2022-09-12 DIAGNOSIS — J45909 Unspecified asthma, uncomplicated: Secondary | ICD-10-CM

## 2022-09-12 DIAGNOSIS — I10 Essential (primary) hypertension: Principal | ICD-10-CM

## 2022-09-12 DIAGNOSIS — D649 Anemia, unspecified: Secondary | ICD-10-CM

## 2022-09-12 DIAGNOSIS — M1990 Unspecified osteoarthritis, unspecified site: Secondary | ICD-10-CM

## 2022-09-30 DIAGNOSIS — E559 Vitamin D deficiency, unspecified: Secondary | ICD-10-CM

## 2022-09-30 DIAGNOSIS — I10 Essential (primary) hypertension: Principal | ICD-10-CM

## 2022-09-30 MED ORDER — ERGOCALCIFEROL 1.25 MG (50000 UT) PO CAPS
1.25 mg | ORAL | 2 refills
Start: 2022-09-30 — End: ?

## 2022-09-30 MED ORDER — AMLODIPINE BESYLATE 10 MG PO TABS
10 mg | Freq: Every day | ORAL | 1 refills
Start: 2022-09-30 — End: ?

## 2022-09-30 MED ORDER — BISOPROLOL-HYDROCHLOROTHIAZIDE 10-6.25 MG PO TABS
1 | ORAL_TABLET | Freq: Every day | ORAL | 1 refills
Start: 2022-09-30 — End: ?

## 2022-10-12 MED ORDER — BISOPROLOL-HYDROCHLOROTHIAZIDE 10-6.25 MG PO TABS
1 | ORAL_TABLET | Freq: Every day | ORAL | 1 refills | Status: CP
Start: 2022-10-12 — End: ?

## 2022-10-12 MED ORDER — VENTOLIN HFA 108 (90 BASE) MCG/ACT IN AERS
2 | Freq: Four times a day (QID) | RESPIRATORY_TRACT | 3 refills | 25.00000 days | Status: CP | PRN
Start: 2022-10-12 — End: ?

## 2022-10-12 MED ORDER — ERGOCALCIFEROL 1.25 MG (50000 UT) PO CAPS
1.25 mg | ORAL | 2 refills | 28.00000 days | Status: CP
Start: 2022-10-12 — End: ?

## 2022-10-12 MED ORDER — AMLODIPINE BESYLATE 10 MG PO TABS
10 mg | Freq: Every day | ORAL | 1 refills | Status: CP
Start: 2022-10-12 — End: ?

## 2022-10-28 MED ORDER — BISOPROLOL-HYDROCHLOROTHIAZIDE 10-6.25 MG PO TABS
1 | ORAL_TABLET | Freq: Every day | ORAL | 0 refills | Status: CP
Start: 2022-10-28 — End: ?

## 2022-10-28 MED ORDER — AMLODIPINE BESYLATE 10 MG PO TABS
10 mg | Freq: Every day | ORAL | 0 refills | Status: CP
Start: 2022-10-28 — End: ?

## 2022-10-28 MED ORDER — VENTOLIN HFA 108 (90 BASE) MCG/ACT IN AERS
2 | Freq: Four times a day (QID) | RESPIRATORY_TRACT | 2 refills | 25.00000 days | Status: CP | PRN
Start: 2022-10-28 — End: ?

## 2022-10-28 MED ORDER — ERGOCALCIFEROL 1.25 MG (50000 UT) PO CAPS
1.25 mg | ORAL | 0 refills | 28.00000 days | Status: CP
Start: 2022-10-28 — End: ?

## 2022-12-17 ENCOUNTER — Ambulatory Visit: Attending: Family

## 2022-12-17 DIAGNOSIS — M62838 Other muscle spasm: Secondary | ICD-10-CM

## 2022-12-17 DIAGNOSIS — D649 Anemia, unspecified: Secondary | ICD-10-CM

## 2022-12-17 DIAGNOSIS — I10 Essential (primary) hypertension: Principal | ICD-10-CM

## 2022-12-17 DIAGNOSIS — K047 Periapical abscess without sinus: Principal | ICD-10-CM

## 2022-12-17 DIAGNOSIS — Z09 Encounter for follow-up examination after completed treatment for conditions other than malignant neoplasm: Secondary | ICD-10-CM

## 2022-12-17 DIAGNOSIS — J45909 Unspecified asthma, uncomplicated: Secondary | ICD-10-CM

## 2022-12-17 DIAGNOSIS — M1990 Unspecified osteoarthritis, unspecified site: Secondary | ICD-10-CM

## 2022-12-17 MED ORDER — CHLORHEXIDINE GLUCONATE 0.12 % MT SOLN
1 refills | Status: CP
Start: 2022-12-17 — End: ?

## 2023-04-13 ENCOUNTER — Ambulatory Visit: Attending: Family | Primary: Family

## 2023-04-13 DIAGNOSIS — I10 Essential (primary) hypertension: Principal | ICD-10-CM

## 2023-04-13 DIAGNOSIS — D649 Anemia, unspecified: Secondary | ICD-10-CM

## 2023-04-13 DIAGNOSIS — M62838 Other muscle spasm: Secondary | ICD-10-CM

## 2023-04-13 DIAGNOSIS — J45909 Unspecified asthma, uncomplicated: Secondary | ICD-10-CM

## 2023-04-13 DIAGNOSIS — M1990 Unspecified osteoarthritis, unspecified site: Secondary | ICD-10-CM

## 2023-04-13 DIAGNOSIS — Z6841 Body Mass Index (BMI) 40.0 and over, adult: Secondary | ICD-10-CM

## 2023-04-13 DIAGNOSIS — E559 Vitamin D deficiency, unspecified: Secondary | ICD-10-CM

## 2023-04-13 DIAGNOSIS — L819 Disorder of pigmentation, unspecified: Principal | ICD-10-CM

## 2023-04-13 MED ORDER — TRETINOIN 0.1 % EX CREA
TOPICAL | 1 refills | 34.00000 days | Status: CP
Start: 2023-04-13 — End: ?

## 2023-04-13 MED ORDER — TRETINOIN 0.05 % EX CREA
1 refills | Status: CN
Start: 2023-04-13 — End: ?

## 2023-04-14 ENCOUNTER — Encounter: Attending: Medical | Primary: Family

## 2023-04-16 ENCOUNTER — Ambulatory Visit: Attending: Medical | Primary: Family

## 2023-04-16 DIAGNOSIS — M62838 Other muscle spasm: Secondary | ICD-10-CM

## 2023-04-16 DIAGNOSIS — M1990 Unspecified osteoarthritis, unspecified site: Secondary | ICD-10-CM

## 2023-04-16 DIAGNOSIS — J45909 Unspecified asthma, uncomplicated: Secondary | ICD-10-CM

## 2023-04-16 DIAGNOSIS — M1712 Unilateral primary osteoarthritis, left knee: Secondary | ICD-10-CM

## 2023-04-16 DIAGNOSIS — D649 Anemia, unspecified: Secondary | ICD-10-CM

## 2023-04-16 DIAGNOSIS — I10 Essential (primary) hypertension: Principal | ICD-10-CM

## 2023-04-16 DIAGNOSIS — Z6841 Body Mass Index (BMI) 40.0 and over, adult: Principal | ICD-10-CM

## 2023-04-16 DIAGNOSIS — M654 Radial styloid tenosynovitis [de Quervain]: Secondary | ICD-10-CM

## 2023-04-16 DIAGNOSIS — M1711 Unilateral primary osteoarthritis, right knee: Secondary | ICD-10-CM

## 2023-04-16 DIAGNOSIS — G5603 Carpal tunnel syndrome, bilateral upper limbs: Secondary | ICD-10-CM

## 2023-04-16 MED ORDER — PREDNISONE 10 MG (21) PO TBPK
ORAL | 0 refills | 6.00000 days | Status: CP
Start: 2023-04-16 — End: ?

## 2023-04-21 ENCOUNTER — Inpatient Hospital Stay: Admit: 2023-04-21 | Discharge: 2023-04-22 | Primary: Family

## 2023-04-21 DIAGNOSIS — M1712 Unilateral primary osteoarthritis, left knee: Secondary | ICD-10-CM

## 2023-04-21 DIAGNOSIS — M1711 Unilateral primary osteoarthritis, right knee: Principal | ICD-10-CM

## 2023-04-30 ENCOUNTER — Encounter: Payer: MEDICAID | Attending: Medical | Primary: Family

## 2023-05-13 ENCOUNTER — Ambulatory Visit: Attending: Family | Primary: Family

## 2023-05-13 DIAGNOSIS — J45909 Unspecified asthma, uncomplicated: Secondary | ICD-10-CM

## 2023-05-13 DIAGNOSIS — I10 Essential (primary) hypertension: Principal | ICD-10-CM

## 2023-05-13 DIAGNOSIS — D649 Anemia, unspecified: Secondary | ICD-10-CM

## 2023-05-13 DIAGNOSIS — M1990 Unspecified osteoarthritis, unspecified site: Secondary | ICD-10-CM

## 2023-05-13 DIAGNOSIS — M62838 Other muscle spasm: Secondary | ICD-10-CM

## 2023-05-13 DIAGNOSIS — N950 Postmenopausal bleeding: Secondary | ICD-10-CM

## 2023-05-13 DIAGNOSIS — Z6841 Body Mass Index (BMI) 40.0 and over, adult: Secondary | ICD-10-CM

## 2023-05-13 MED ORDER — AMLODIPINE BESYLATE 10 MG PO TABS
10 mg | Freq: Every day | ORAL | 1 refills | Status: CP
Start: 2023-05-13 — End: ?

## 2023-05-13 MED ORDER — BISOPROLOL-HYDROCHLOROTHIAZIDE 10-6.25 MG PO TABS
1 | ORAL_TABLET | Freq: Every day | ORAL | 1 refills | Status: CP
Start: 2023-05-13 — End: ?

## 2023-05-13 MED ORDER — NORETHINDRONE ACETATE 5 MG PO TABS
10 mg | Freq: Every day | ORAL | 0 refills | Status: CP
Start: 2023-05-13 — End: ?

## 2023-05-14 ENCOUNTER — Encounter: Attending: Family | Primary: Family

## 2023-05-25 ENCOUNTER — Ambulatory Visit: Payer: MEDICAID | Attending: Dermatology | Primary: Family

## 2023-05-25 DIAGNOSIS — L819 Disorder of pigmentation, unspecified: Principal | ICD-10-CM

## 2023-05-25 MED ORDER — HYDROQUINONE 4 % EX CREA
TOPICAL | 5 refills | Status: CP
Start: 2023-05-25 — End: ?

## 2023-06-18 ENCOUNTER — Inpatient Hospital Stay: Admit: 2023-06-18 | Discharge: 2023-06-19 | Payer: MEDICAID | Primary: Family

## 2023-06-18 ENCOUNTER — Encounter: Primary: Family

## 2023-06-18 DIAGNOSIS — N950 Postmenopausal bleeding: Principal | ICD-10-CM

## 2023-06-20 DIAGNOSIS — L819 Disorder of pigmentation, unspecified: Principal | ICD-10-CM

## 2023-06-21 MED ORDER — TRETINOIN 0.1 % EX CREA
TOPICAL | 1 refills | 34.00000 days | Status: CP
Start: 2023-06-21 — End: ?

## 2023-08-20 ENCOUNTER — Encounter: Payer: MEDICAID | Attending: Dermatology | Primary: Family

## 2023-08-23 ENCOUNTER — Ambulatory Visit: Payer: MEDICAID | Attending: Obstetrics & Gynecology | Primary: Family

## 2023-08-23 DIAGNOSIS — D649 Anemia, unspecified: Secondary | ICD-10-CM

## 2023-08-23 DIAGNOSIS — N950 Postmenopausal bleeding: Principal | ICD-10-CM

## 2023-08-23 DIAGNOSIS — I10 Essential (primary) hypertension: Principal | ICD-10-CM

## 2023-08-23 DIAGNOSIS — M62838 Other muscle spasm: Secondary | ICD-10-CM

## 2023-08-23 DIAGNOSIS — Z3202 Encounter for pregnancy test, result negative: Secondary | ICD-10-CM

## 2023-08-23 DIAGNOSIS — M1990 Unspecified osteoarthritis, unspecified site: Secondary | ICD-10-CM

## 2023-08-23 DIAGNOSIS — M810 Age-related osteoporosis without current pathological fracture: Secondary | ICD-10-CM

## 2023-08-23 DIAGNOSIS — J45909 Unspecified asthma, uncomplicated: Secondary | ICD-10-CM

## 2023-08-25 ENCOUNTER — Encounter: Payer: MEDICAID | Attending: Obstetrics & Gynecology | Primary: Family

## 2023-12-17 ENCOUNTER — Ambulatory Visit: Payer: MEDICAID | Attending: Family | Primary: Family

## 2023-12-17 DIAGNOSIS — I10 Essential (primary) hypertension: Principal | ICD-10-CM

## 2023-12-17 DIAGNOSIS — M1990 Unspecified osteoarthritis, unspecified site: Secondary | ICD-10-CM

## 2023-12-17 DIAGNOSIS — J45909 Unspecified asthma, uncomplicated: Secondary | ICD-10-CM

## 2023-12-17 DIAGNOSIS — G894 Chronic pain syndrome: Secondary | ICD-10-CM

## 2023-12-17 DIAGNOSIS — M810 Age-related osteoporosis without current pathological fracture: Secondary | ICD-10-CM

## 2023-12-17 DIAGNOSIS — D649 Anemia, unspecified: Secondary | ICD-10-CM

## 2023-12-17 DIAGNOSIS — Z6841 Body Mass Index (BMI) 40.0 and over, adult: Principal | ICD-10-CM

## 2023-12-17 DIAGNOSIS — M62838 Other muscle spasm: Secondary | ICD-10-CM

## 2023-12-17 MED ORDER — METHYLPREDNISOLONE 4 MG PO TBPK
ORAL | 0 refills | 6.00000 days | Status: CP
Start: 2023-12-17 — End: ?

## 2023-12-17 MED ORDER — AMITRIPTYLINE HCL 25 MG PO TABS
25 mg | Freq: Every evening | ORAL | 0 refills | 30.00000 days | Status: CP
Start: 2023-12-17 — End: ?

## 2024-01-10 MED ORDER — METHYLPREDNISOLONE 4 MG PO TBPK
0 refills
Start: 2024-01-10 — End: ?

## 2024-01-27 ENCOUNTER — Encounter: Payer: MEDICAID | Attending: Family | Primary: Family

## 2024-02-24 ENCOUNTER — Ambulatory Visit: Payer: MEDICAID | Attending: Family | Primary: Family

## 2024-02-24 DIAGNOSIS — M810 Age-related osteoporosis without current pathological fracture: Secondary | ICD-10-CM

## 2024-02-24 DIAGNOSIS — M1711 Unilateral primary osteoarthritis, right knee: Secondary | ICD-10-CM

## 2024-02-24 DIAGNOSIS — D649 Anemia, unspecified: Secondary | ICD-10-CM

## 2024-02-24 DIAGNOSIS — I10 Essential (primary) hypertension: Principal | ICD-10-CM

## 2024-02-24 DIAGNOSIS — M1712 Unilateral primary osteoarthritis, left knee: Principal | ICD-10-CM

## 2024-02-24 DIAGNOSIS — M1990 Unspecified osteoarthritis, unspecified site: Secondary | ICD-10-CM

## 2024-02-24 DIAGNOSIS — R053 Cough, persistent: Secondary | ICD-10-CM

## 2024-02-24 DIAGNOSIS — Z6841 Body Mass Index (BMI) 40.0 and over, adult: Secondary | ICD-10-CM

## 2024-02-24 DIAGNOSIS — J4520 Mild intermittent asthma, uncomplicated: Secondary | ICD-10-CM

## 2024-02-24 DIAGNOSIS — M62838 Other muscle spasm: Secondary | ICD-10-CM

## 2024-02-24 DIAGNOSIS — J45909 Unspecified asthma, uncomplicated: Secondary | ICD-10-CM

## 2024-02-24 MED ORDER — CETIRIZINE HCL 10 MG PO TABS
10 mg | Freq: Every day | ORAL | 0 refills | Status: CP
Start: 2024-02-24 — End: ?

## 2024-02-24 MED ORDER — FLUTICASONE-SALMETEROL 115-21 MCG/ACT IN AERO
2 | Freq: Two times a day (BID) | RESPIRATORY_TRACT | 1 refills | 30.00000 days | Status: CP
Start: 2024-02-24 — End: ?

## 2024-02-24 MED ORDER — METHYLPREDNISOLONE 4 MG PO TBPK
ORAL | 0 refills | 6.00000 days | Status: CP
Start: 2024-02-24 — End: ?

## 2024-02-24 MED ORDER — BENZONATATE 100 MG PO CAPS
100 mg | Freq: Three times a day (TID) | ORAL | 0 refills | Status: CP | PRN
Start: 2024-02-24 — End: ?

## 2024-02-25 DIAGNOSIS — L819 Disorder of pigmentation, unspecified: Principal | ICD-10-CM

## 2024-02-25 MED ORDER — HYDROQUINONE 4 % EX CREA
TOPICAL | 5 refills | Status: CP
Start: 2024-02-25 — End: ?

## 2024-03-13 DIAGNOSIS — G894 Chronic pain syndrome: Principal | ICD-10-CM

## 2024-03-13 MED ORDER — AMITRIPTYLINE HCL 25 MG PO TABS
25 mg | Freq: Every evening | ORAL | 0 refills | 30.00000 days | Status: CP
Start: 2024-03-13 — End: ?

## 2024-03-14 ENCOUNTER — Encounter: Admit: 2024-03-14 | Payer: MEDICAID | Primary: Family

## 2024-03-14 ENCOUNTER — Inpatient Hospital Stay: Admit: 2024-03-14 | Payer: MEDICAID | Primary: Family

## 2024-03-14 DIAGNOSIS — J45909 Unspecified asthma, uncomplicated: Secondary | ICD-10-CM

## 2024-03-14 DIAGNOSIS — M25561 Pain in right knee: Secondary | ICD-10-CM

## 2024-03-14 DIAGNOSIS — M25562 Pain in left knee: Principal | ICD-10-CM

## 2024-03-14 DIAGNOSIS — M810 Age-related osteoporosis without current pathological fracture: Secondary | ICD-10-CM

## 2024-03-14 DIAGNOSIS — M1990 Unspecified osteoarthritis, unspecified site: Secondary | ICD-10-CM

## 2024-03-14 DIAGNOSIS — M62838 Other muscle spasm: Secondary | ICD-10-CM

## 2024-03-14 DIAGNOSIS — I10 Essential (primary) hypertension: Principal | ICD-10-CM

## 2024-03-14 DIAGNOSIS — 1 ERRONEOUS ENCOUNTER--DISREGARD: Secondary | ICD-9-CM

## 2024-03-14 DIAGNOSIS — D649 Anemia, unspecified: Secondary | ICD-10-CM
